# Patient Record
Sex: Male | Born: 1945 | Race: White | Hispanic: No | Marital: Married | Smoking: Current every day smoker
Health system: Southern US, Community
[De-identification: ages and names within clinical notes are randomized; demographics above are authoritative.]

## PROBLEM LIST (undated history)

## (undated) DIAGNOSIS — F1020 Alcohol dependence, uncomplicated: Secondary | ICD-10-CM

## (undated) DIAGNOSIS — K219 Gastro-esophageal reflux disease without esophagitis: Secondary | ICD-10-CM

## (undated) DIAGNOSIS — M1990 Unspecified osteoarthritis, unspecified site: Secondary | ICD-10-CM

## (undated) HISTORY — PX: NECK SURGERY: SHX720

---

## 2001-01-19 ENCOUNTER — Emergency Department (HOSPITAL_COMMUNITY): Admission: EM | Admit: 2001-01-19 | Discharge: 2001-01-19 | Payer: Self-pay | Admitting: Internal Medicine

## 2001-01-19 ENCOUNTER — Emergency Department (HOSPITAL_COMMUNITY): Admission: EM | Admit: 2001-01-19 | Discharge: 2001-01-19 | Payer: Self-pay | Admitting: Emergency Medicine

## 2001-07-14 ENCOUNTER — Emergency Department (HOSPITAL_COMMUNITY): Admission: EM | Admit: 2001-07-14 | Discharge: 2001-07-14 | Payer: Self-pay | Admitting: *Deleted

## 2001-07-14 ENCOUNTER — Emergency Department (HOSPITAL_COMMUNITY): Admission: EM | Admit: 2001-07-14 | Discharge: 2001-07-14 | Payer: Self-pay | Admitting: Emergency Medicine

## 2001-08-13 ENCOUNTER — Encounter: Payer: Self-pay | Admitting: Emergency Medicine

## 2001-08-13 ENCOUNTER — Emergency Department (HOSPITAL_COMMUNITY): Admission: EM | Admit: 2001-08-13 | Discharge: 2001-08-13 | Payer: Self-pay | Admitting: Emergency Medicine

## 2011-04-23 ENCOUNTER — Emergency Department (HOSPITAL_COMMUNITY): Payer: Medicare Other

## 2011-04-23 ENCOUNTER — Emergency Department (HOSPITAL_COMMUNITY)
Admission: EM | Admit: 2011-04-23 | Discharge: 2011-04-23 | Disposition: A | Discharge status: Home / Self Care | Payer: Medicare Other | Attending: Emergency Medicine | Admitting: Emergency Medicine

## 2011-04-23 ENCOUNTER — Encounter: Payer: Self-pay | Admitting: Emergency Medicine

## 2011-04-23 DIAGNOSIS — R112 Nausea with vomiting, unspecified: Secondary | ICD-10-CM

## 2011-04-23 DIAGNOSIS — F10929 Alcohol use, unspecified with intoxication, unspecified: Secondary | ICD-10-CM

## 2011-04-23 DIAGNOSIS — W19XXXA Unspecified fall, initial encounter: Secondary | ICD-10-CM | POA: Insufficient documentation

## 2011-04-23 DIAGNOSIS — Y9229 Other specified public building as the place of occurrence of the external cause: Secondary | ICD-10-CM | POA: Insufficient documentation

## 2011-04-23 DIAGNOSIS — R1110 Vomiting, unspecified: Secondary | ICD-10-CM | POA: Insufficient documentation

## 2011-04-23 DIAGNOSIS — S93409A Sprain of unspecified ligament of unspecified ankle, initial encounter: Secondary | ICD-10-CM

## 2011-04-23 DIAGNOSIS — F17200 Nicotine dependence, unspecified, uncomplicated: Secondary | ICD-10-CM | POA: Insufficient documentation

## 2011-04-23 LAB — BASIC METABOLIC PANEL
BUN: 7 mg/dL (ref 6–23)
Chloride: 96 mEq/L (ref 96–112)
Glucose, Bld: 84 mg/dL (ref 70–99)
Potassium: 3.8 mEq/L (ref 3.5–5.1)

## 2011-04-23 LAB — CBC
HCT: 39.3 % (ref 39.0–52.0)
Hemoglobin: 13.8 g/dL (ref 13.0–17.0)
MCH: 33.1 pg (ref 26.0–34.0)
MCHC: 35.1 g/dL (ref 30.0–36.0)
MCV: 94.2 fL (ref 78.0–100.0)
Platelets: 295 10*3/uL (ref 150–400)
RBC: 4.17 MIL/uL — ABNORMAL LOW (ref 4.22–5.81)
RDW: 13.3 % (ref 11.5–15.5)
WBC: 8.9 10*3/uL (ref 4.0–10.5)

## 2011-04-23 LAB — BASIC METABOLIC PANEL WITH GFR
CO2: 28 meq/L (ref 19–32)
Calcium: 8.6 mg/dL (ref 8.4–10.5)
Creatinine, Ser: 0.8 mg/dL (ref 0.50–1.35)
GFR calc Af Amer: >60 mL/min (ref >60)
GFR calc non Af Amer: >60 mL/min (ref >60)
Sodium: 134 meq/L — ABNORMAL LOW (ref 135–145)

## 2011-04-23 LAB — ETHANOL: Alcohol, Ethyl (B): 277 mg/dL — ABNORMAL HIGH (ref 0–11)

## 2011-04-23 MED ORDER — ONDANSETRON HCL 4 MG/2ML IJ SOLN: 4.0000 mg | Freq: Once | INTRAMUSCULAR | Status: DC

## 2011-04-23 NOTE — ED Notes (Signed)
Pt attempted to leave ed w/ iv in. Removed iv & advised nurse.
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
## 2011-04-23 NOTE — ED Notes (Signed)
IVC papers being resended by Dr. Oletta Lamas
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
## 2011-04-23 NOTE — ED Provider Notes (Addendum)
History     CSN: [id number] Arrival date & time: 04/23/2011  4:11 PM  Chief Complaint  Patient presents with  . Ankle Pain  . Fall   HPI Comments: A level V caveat due to the patient's condition and acute intoxication. Per EMS the patient sustained a fall due to his intoxication with pain to his left ankle in their opinion obvious deformity. The patient arrives with a long leg posterior splint applied to his left lower leg. The patient reports no significant injuries from the fall. While examining the patient the patient did lean over and vomited into the trash can. There is no solid food or any hematemesis present. The patient denies any chest pain, shortness of breath cough abdominal pain. He denies hitting his head. Per EMS there was no reported loss of consciousness.  Patient is a 64 y.o. male presenting with ankle pain and fall. The history is provided by the patient and the EMS personnel. The history is limited by the condition of the patient. No language interpreter was used.  Ankle Pain   Fall    History reviewed. No pertinent past medical history.  Past Surgical History  Procedure Date  . Neck surgery     No family history on file.  History  Substance Use Topics  . Smoking status: Current Everyday Smoker -- 0.5 packs/day  . Smokeless tobacco: Not on file  . Alcohol Use: Yes      Review of Systems  Unable to perform ROS: Other    Physical Exam  BP 107/67  Pulse 97  Temp(Src) 97.8 F (36.6 C) (Oral)  SpO2 96%  Physical Exam  Constitutional: He appears well-developed and well-nourished. No distress.  HENT:  Head: Normocephalic and atraumatic.  Neck: Normal range of motion. Neck supple. No spinous process tenderness and no muscular tenderness present.  Cardiovascular: Normal rate and regular rhythm.   Pulmonary/Chest: Effort normal. Not tachypneic. No respiratory distress.  Abdominal: Soft. There is no tenderness. There is no rebound.  Musculoskeletal:     Left ankle: He exhibits swelling. He exhibits normal pulse. tenderness. Lateral malleolus and medial malleolus tenderness found.  Neurological: He is alert. He has normal strength.  Psychiatric: His affect is inappropriate. His speech is slurred. He is is hyperactive. He is not withdrawn and not actively hallucinating. Thought content is not paranoid. Cognition and memory are impaired. He expresses impulsivity and inappropriate judgment. He expresses no suicidal plans and no homicidal plans.    ED Course  Procedures  MDM    5:06 PM  I reviewed patient's plain films. Per radiologist patient has no acute fracture of his tib-fib, ankle, foot on the left side.  Will place in ankle splint and retest his ankle function once he is feeling somewhat improved and more sober.    6:00 PM No more vomiting.  Pt is conversant, moving 4 extremities, recheck of ankle, shows swelling, but 5/5 strength with plantar flexion.  Will put in splint and can be discharged once an appropriate mechanism to get him home or to his friend's home that he is reportedly staying at can be found.    Gavin Pound. Pricila Bridge, MD 04/23/11 1801  6:57 PM Patient continues to behave inappropriately. Although he is able to ambulate well on this sprained ankle, she will not listen to staff or to myself as far as waiting here in the emergency department until a safe mode of transport to establish residence is transferred at this time please have been involved to  investigate where he reports his residence is to make sure that there is someone there to help him once he is actually discharge. For now we'll hold him involuntarily since he is intoxicated and shows an appropriate judgment and there is concern that he may harm himself due to his current intoxicated state.  7:00 PM Pt's ankle has been splinted, will not cooperate with using ice.  I don't feel taking NSAIDs given his current intoxication is needed.    Gavin Pound. Tomeeka Plaugher,  MD 04/23/11 1901   7:53 PM Pt now has a destination and safe method of transport.  May be discharged to home  Gavin Pound. Oletta Lamas, MD 04/23/11 1191

## 2011-05-01 ENCOUNTER — Emergency Department (HOSPITAL_COMMUNITY): Payer: Medicare Other

## 2011-05-01 ENCOUNTER — Encounter (HOSPITAL_COMMUNITY): Payer: Self-pay | Admitting: *Deleted

## 2011-05-01 ENCOUNTER — Emergency Department (HOSPITAL_COMMUNITY)
Admission: EM | Admit: 2011-05-01 | Discharge: 2011-05-01 | Disposition: A | Discharge status: Other Acute Inpatient Hospital | Payer: Medicare Other | Attending: Emergency Medicine | Admitting: Emergency Medicine

## 2011-05-01 ENCOUNTER — Inpatient Hospital Stay (HOSPITAL_COMMUNITY)
Admission: RE | Admit: 2011-05-01 | Discharge: 2011-05-06 | DRG: 897 | Disposition: A | Discharge status: Home / Self Care | Payer: 59 | Source: Ambulatory Visit | Attending: Psychiatry | Admitting: Psychiatry

## 2011-05-01 DIAGNOSIS — F1994 Other psychoactive substance use, unspecified with psychoactive substance-induced mood disorder: Secondary | ICD-10-CM

## 2011-05-01 DIAGNOSIS — F1010 Alcohol abuse, uncomplicated: Secondary | ICD-10-CM

## 2011-05-01 DIAGNOSIS — F17200 Nicotine dependence, unspecified, uncomplicated: Secondary | ICD-10-CM

## 2011-05-01 DIAGNOSIS — M810 Age-related osteoporosis without current pathological fracture: Secondary | ICD-10-CM

## 2011-05-01 DIAGNOSIS — S93409A Sprain of unspecified ligament of unspecified ankle, initial encounter: Secondary | ICD-10-CM | POA: Insufficient documentation

## 2011-05-01 DIAGNOSIS — F1020 Alcohol dependence, uncomplicated: Principal | ICD-10-CM

## 2011-05-01 DIAGNOSIS — M129 Arthropathy, unspecified: Secondary | ICD-10-CM

## 2011-05-01 DIAGNOSIS — S93402A Sprain of unspecified ligament of left ankle, initial encounter: Secondary | ICD-10-CM

## 2011-05-01 DIAGNOSIS — R45851 Suicidal ideations: Secondary | ICD-10-CM

## 2011-05-01 DIAGNOSIS — W19XXXA Unspecified fall, initial encounter: Secondary | ICD-10-CM | POA: Insufficient documentation

## 2011-05-01 DIAGNOSIS — Z79899 Other long term (current) drug therapy: Secondary | ICD-10-CM

## 2011-05-01 HISTORY — DX: Alcohol dependence, uncomplicated: F10.20

## 2011-05-01 LAB — RAPID URINE DRUG SCREEN, HOSP PERFORMED
Barbiturates: NOT DETECTED
Cocaine: NOT DETECTED
Tetrahydrocannabinol: NOT DETECTED

## 2011-05-01 LAB — BASIC METABOLIC PANEL
BUN: 7 mg/dL (ref 6–23)
Chloride: 94 mEq/L — ABNORMAL LOW (ref 96–112)
Glucose, Bld: 78 mg/dL (ref 70–99)
Potassium: 4.2 mEq/L (ref 3.5–5.1)

## 2011-05-01 LAB — DIFFERENTIAL
Basophils Relative: 1 % (ref 0–1)
Eosinophils Absolute: 0.4 10*3/uL (ref 0.0–0.7)
Eosinophils Relative: 5 % (ref 0–5)
Monocytes Absolute: 0.7 10*3/uL (ref 0.1–1.0)
Monocytes Relative: 8 % (ref 3–12)

## 2011-05-01 LAB — CBC
HCT: 40.5 % (ref 39.0–52.0)
Hemoglobin: 14.6 g/dL (ref 13.0–17.0)
MCH: 34.1 pg — ABNORMAL HIGH (ref 26.0–34.0)
MCHC: 36 g/dL (ref 30.0–36.0)
MCV: 94.6 fL (ref 78.0–100.0)

## 2011-05-01 NOTE — ED Notes (Signed)
Dakota Higgins, from [facility] aware of pt here

## 2011-05-01 NOTE — ED Notes (Addendum)
Pt wants help to quit drinking. Pt states that he has been drunk for 2 weeks. Pt states that he wanted to kill himself today. Pt states that he has not eat for 3 weeks. Pt also c/o pain and swelling in his left ankle from injury 2 weeks ago.

## 2011-05-01 NOTE — ED Provider Notes (Signed)
History     CSN: [id number] Arrival date & time: 05/01/2011  1:50 PM  Chief Complaint  Patient presents with  . Addiction Problem   HPI Pt was seen at 1410.  Per pt, c/o gradual onset and worsening of persistent etoh abuse "all my life," worse over the past 2 weeks.  Pt states he called 911 today because he was "going to kill himself by jumping off the balcony" of a local hotel.  Pt c/o continued left ankle pain and swelling from fall 1 week ago, for which he was seen in ED for same, dx ankle sprain.  Pt states he "was too drunk" and "doesn't remember" that ED visit.  Denies CP/SOB, no abd pain, no back pain, no N/V/D, no focal motor weakness, no tingling/numbness in extremities.  Denies HI.     Past Medical History  Diagnosis Date  . Alcoholism     Past Surgical History  Procedure Date  . Neck surgery   . Neck surgery     History reviewed. No pertinent family history.  History  Substance Use Topics  . Smoking status: Current Everyday Smoker -- 1.0 packs/day  . Smokeless tobacco: Not on file  . Alcohol Use: Yes     daily beer and occasional liquor     Review of Systems ROS: Statement: All systems negative except as marked or noted in the HPI; Constitutional: Negative for fever and chills. ; ; Eyes: Negative for eye pain, redness and discharge. ; ; ENMT: Negative for ear pain, hoarseness, nasal congestion, sinus pressure and sore throat. ; ; Cardiovascular: Negative for chest pain, palpitations, diaphoresis, dyspnea and peripheral edema. ; ; Respiratory: Negative for cough, wheezing and stridor. ; ; Gastrointestinal: Negative for nausea, vomiting, diarrhea and abdominal pain, blood in stool, hematemesis, jaundice and rectal bleeding. . ; ; Genitourinary: Negative for dysuria, flank pain and hematuria. ; ; Musculoskeletal: Negative for back pain and neck pain. Negative for new trauma. +left ankle swelling. ; Skin: Negative for pruritus, rash, abrasions, blisters, bruising and skin  lesion.; ; Neuro: Negative for headache, lightheadedness and neck stiffness. Negative for weakness, altered level of consciousness , altered mental status, extremity weakness, paresthesias, involuntary movement, seizure and syncope.  +SI, no HI.   Physical Exam  BP 142/101  Pulse 98  Temp(Src) 98.3 F (36.8 C) (Oral)  Resp 18  Ht 5\' 10"  (1.778 m)  Wt 140 lb (63.504 kg)  BMI 20.09 kg/m2  SpO2 98%  Physical Exam 1415: Physical examination:  Nursing notes reviewed; Vital signs and O2 SAT reviewed;  Constitutional: Well developed, Well nourished, Well hydrated, In no acute distress; Head:  Normocephalic, atraumatic; Eyes: EOMI, PERRL, No scleral icterus; ENMT: Mouth and pharynx normal, Mucous membranes moist; Neck: Supple, Full range of motion, No lymphadenopathy; Cardiovascular: Regular rate and rhythm, No murmur, rub, or gallop; Respiratory: Breath sounds clear & equal bilaterally, No rales, rhonchi, wheezes, or rub, Normal respiratory effort/excursion; Chest: Nontender, Movement normal; Abdomen: Soft, Nontender, Nondistended, Normal bowel sounds; Extremities: Pulses normal, No calf edema or asymmetry. +mild tenderness to palp left lateral maleolar area w/localized edema, NMS intact left foot, strong pedal pp, LE muscle compartments soft.  No left proximal fibular head tenderness, no knee tenderness.  +plantarflexion of left foot w/calf squeeze.  No palpable gap left Achilles's tendon.; Neuro: AA&Ox3, Major CN grossly intact.  No gross focal motor or sensory deficits in extremities.; Skin: Color normal, Warm, Dry.  Affect:  Flat, +SI.   ED Course  Procedures  MDM  MDM Reviewed: previous chart, nursing note and vitals Interpretation: labs and x-ray   Seen in ED for etoh intox and fall 1 week ago.  XR left ankle and foot at that were neg for acute fx.  Will re-check left ankle xray today, ck medical clearance labs, consult [facility] for admit.  1715:  [facility] team at bedside for eval.   7:33 PM:  Pt  accepted to [facility].  Will transfer stable.   Bach Rocchi Allison Quarry, DO 05/03/11 2138

## 2011-05-02 DIAGNOSIS — F39 Unspecified mood [affective] disorder: Secondary | ICD-10-CM

## 2011-05-02 DIAGNOSIS — F1020 Alcohol dependence, uncomplicated: Secondary | ICD-10-CM

## 2011-05-02 LAB — HEPATIC FUNCTION PANEL
AST: 49 U/L — ABNORMAL HIGH (ref 0–37)
Albumin: 3.5 g/dL (ref 3.5–5.2)
Bilirubin, Direct: 0.1 mg/dL (ref 0.0–0.3)

## 2011-05-05 NOTE — Assessment & Plan Note (Signed)
NAMEMarland Higgins  BOWMAN, [name]:  [id number]  MEDICAL RECORD NO.:  [id number]  LOCATION:  0307                          [facility]ACILITY:  [facility]  PHYSICIAN:  Orson Aloe, MD       DATE OF BIRTH:  06-17-1946  DATE OF ADMISSION:  05/01/2011 DATE OF DISCHARGE:                      PSYCHIATRIC ADMISSION ASSESSMENT   IDENTIFICATION:  This is a 65 year old male who was voluntarily admitted on May 01, 2011.  HISTORY OF PRESENT ILLNESS:  The patient presents with a history depression and suicidal thoughts, also wanting to get detoxed off of alcohol.  He has been binge drinking for approximately 3 weeks.  His drinking has been escalating with changes in his behavior.  He was having some passive suicidal thoughts to have someone take his life.  He had a recent breakup with his girlfriend.  She was getting tired of his behavior when his drinking increases, and she returned back home from a trip that they were on.  He has had several periods of sobriety with his longest being about 2-1/2 years.  PAST PSYCHIATRIC HISTORY:  First admission to [facility]. No current outpatient mental health treatment.  SOCIAL HISTORY:  The patient is single.  He has been in a relationship for the past 11 years.  He is retired, hoping to return back to [place].  He has been in [employer] and has past charges of public intoxication.  FAMILY HISTORY:  None.  ALCOHOL AND DRUG HISTORY:  Again, the patient has been drinking heavily. He denies any seizures or blackouts and has no current legal issues.  He denies any other substance use, and his longest history of sobriety has been 2-1/2 years.  PRIMARY CARE PROVIDER:  [facility].  MEDICAL PROBLEMS: 1. Recent ankle injury.  The patient does not remember sustaining any     injury, but he had woken up with a swollen ankle. 2. History of arthritis.  MEDICATIONS: 1. The patient lists Paxil 40 mg daily. 2. Quetiapine 100 mg nightly. 3.  Megestrol 200 mg by mouth every day to improve anorexia. 4. Etodolac 1 tablet by mouth b.i.d. for arthritis. 5. Calcium with vitamin D b.i.d. for prevention of osteoporosis. 6. Alendronate 1 tablet by mouth weekly to be given on Mondays.  DRUG ALLERGIES:  No known allergies.  PHYSICAL EXAM:  GENERAL:  This is a slender middle-aged male assessed in the emergency department.  He voices no complaints.  His alcohol level was 146 upon arrival to the emergency room.  His left ankle shows soft- tissue swelling.  Sodium of 132, potassium of 4.2.  Urine drug screen is negative.  MENTAL STATUS EXAM:  The patient is resting in bed.  He is dressed in scrubs.  He is cooperative.  His speech is clear, normal pace and tone. He gets teary-eyed at times.  He wants to get help with his alcohol use. He feels he could also benefit from some counseling.  He denies any suicidal thoughts or any psychotic symptoms.  Cognitive function intact. His memory appears intact.  Judgment and insight appear to be good.  DIAGNOSES:  Axis I:  Alcohol dependence, rule out substance-induced mood disorder. Axis II:  Deferred. Axis III:  History of arthritis and recent ankle injury. Axis IV:  Psychosocial problems related to chronic alcohol use and possible problems related to his girlfriend. Axis V:  GAF is 35-40.  PLAN:  Our plan is to place the patient on a Librium protocol to monitor any withdrawal symptoms.  We will continue with his Paxil and Seroquel, and we encourage fluids and group activity.  We will help the patient obtain follow-up.  His tentative length of stay at this time is 3-5 days.     Dakota Higgins, N.P.   ______________________________ Orson Aloe, MD    JO/MEDQ  D:  05/02/2011  T:  05/02/2011  Job:  [id number]  Electronically Signed by Limmie PatriciaP. on 05/04/2011 09:09:58 AM Electronically Signed by Orson Aloe  on 05/05/2011 04:32:55 PM
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
## 2014-05-16 ENCOUNTER — Observation Stay (HOSPITAL_COMMUNITY): Payer: Non-veteran care

## 2014-05-16 ENCOUNTER — Emergency Department (HOSPITAL_COMMUNITY): Payer: Non-veteran care

## 2014-05-16 ENCOUNTER — Observation Stay (HOSPITAL_COMMUNITY)
Admission: EM | Admit: 2014-05-16 | Discharge: 2014-05-17 | Disposition: A | Discharge status: Home / Self Care | Payer: Non-veteran care | Attending: Internal Medicine | Admitting: Internal Medicine

## 2014-05-16 ENCOUNTER — Encounter (HOSPITAL_COMMUNITY): Payer: Self-pay | Admitting: Emergency Medicine

## 2014-05-16 DIAGNOSIS — Z79899 Other long term (current) drug therapy: Secondary | ICD-10-CM | POA: Insufficient documentation

## 2014-05-16 DIAGNOSIS — R079 Chest pain, unspecified: Principal | ICD-10-CM | POA: Insufficient documentation

## 2014-05-16 DIAGNOSIS — F17200 Nicotine dependence, unspecified, uncomplicated: Secondary | ICD-10-CM | POA: Insufficient documentation

## 2014-05-16 DIAGNOSIS — M129 Arthropathy, unspecified: Secondary | ICD-10-CM | POA: Diagnosis not present

## 2014-05-16 DIAGNOSIS — R9389 Abnormal findings on diagnostic imaging of other specified body structures: Secondary | ICD-10-CM | POA: Diagnosis present

## 2014-05-16 DIAGNOSIS — K219 Gastro-esophageal reflux disease without esophagitis: Secondary | ICD-10-CM | POA: Insufficient documentation

## 2014-05-16 DIAGNOSIS — R209 Unspecified disturbances of skin sensation: Secondary | ICD-10-CM | POA: Diagnosis not present

## 2014-05-16 DIAGNOSIS — Z720 Tobacco use: Secondary | ICD-10-CM | POA: Diagnosis present

## 2014-05-16 HISTORY — DX: Unspecified osteoarthritis, unspecified site: M19.90

## 2014-05-16 HISTORY — DX: Gastro-esophageal reflux disease without esophagitis: K21.9

## 2014-05-16 LAB — CBC
HEMATOCRIT: 38.7 % — AB (ref 39.0–52.0)
HEMOGLOBIN: 13.4 g/dL (ref 13.0–17.0)
MCH: 33.3 pg (ref 26.0–34.0)
MCHC: 34.6 g/dL (ref 30.0–36.0)
MCV: 96.3 fL (ref 78.0–100.0)
Platelets: 291 10*3/uL (ref 150–400)
RBC: 4.02 MIL/uL — ABNORMAL LOW (ref 4.22–5.81)
RDW: 14.1 % (ref 11.5–15.5)
WBC: 6.9 10*3/uL (ref 4.0–10.5)

## 2014-05-16 LAB — URINALYSIS, ROUTINE W REFLEX MICROSCOPIC
Bilirubin Urine: NEGATIVE
Glucose, UA: NEGATIVE mg/dL
Hgb urine dipstick: NEGATIVE
KETONES UR: NEGATIVE mg/dL
LEUKOCYTES UA: NEGATIVE
NITRITE: NEGATIVE
PROTEIN: NEGATIVE mg/dL
Specific Gravity, Urine: 1.013 (ref 1.005–1.030)
UROBILINOGEN UA: 0.2 mg/dL (ref 0.0–1.0)
pH: 7 (ref 5.0–8.0)

## 2014-05-16 LAB — COMPREHENSIVE METABOLIC PANEL
ALBUMIN: 3.5 g/dL (ref 3.5–5.2)
ALT: 11 U/L (ref 0–53)
ANION GAP: 11 (ref 5–15)
AST: 16 U/L (ref 0–37)
Alkaline Phosphatase: 66 U/L (ref 39–117)
BUN: 15 mg/dL (ref 6–23)
CALCIUM: 8.6 mg/dL (ref 8.4–10.5)
CO2: 28 mEq/L (ref 19–32)
Chloride: 103 mEq/L (ref 96–112)
Creatinine, Ser: 0.96 mg/dL (ref 0.50–1.35)
GFR calc non Af Amer: 83 mL/min — ABNORMAL LOW (ref >90)
GLUCOSE: 89 mg/dL (ref 70–99)
POTASSIUM: 3.8 meq/L (ref 3.7–5.3)
Sodium: 142 mEq/L (ref 137–147)
TOTAL PROTEIN: 6.7 g/dL (ref 6.0–8.3)
Total Bilirubin: <0.2 mg/dL — ABNORMAL LOW (ref 0.3–1.2)

## 2014-05-16 LAB — I-STAT TROPONIN, ED
TROPONIN I, POC: 0.01 ng/mL (ref 0.00–0.08)
Troponin i, poc: 0 ng/mL (ref 0.00–0.08)

## 2014-05-16 LAB — PROTIME-INR
INR: 1.19 (ref 0.00–1.49)
PROTHROMBIN TIME: 15.1 s (ref 11.6–15.2)

## 2014-05-16 LAB — TROPONIN I: Troponin I: <0.3 ng/mL (ref <0.30)

## 2014-05-16 LAB — APTT: APTT: 31 s (ref 24–37)

## 2014-05-16 MED ORDER — ONDANSETRON HCL 4 MG/2ML IJ SOLN: 4.0000 mg | Freq: Four times a day (QID) | INTRAMUSCULAR | Status: DC | PRN

## 2014-05-16 MED ORDER — GI COCKTAIL ~~LOC~~
30.0000 mL | Freq: Once | ORAL | Status: AC
  Administered 2014-05-16: 30 mL via ORAL
  Filled 2014-05-16: qty 30

## 2014-05-16 MED ORDER — ASPIRIN 81 MG PO CHEW: 324.0000 mg | CHEWABLE_TABLET | Freq: Once | ORAL | Status: DC

## 2014-05-16 MED ORDER — SODIUM CHLORIDE 0.9 % IV SOLN
INTRAVENOUS | Status: AC
  Administered 2014-05-16: 18:00:00 via INTRAVENOUS

## 2014-05-16 MED ORDER — ALUM & MAG HYDROXIDE-SIMETH 200-200-20 MG/5ML PO SUSP: 30.0000 mL | Freq: Four times a day (QID) | ORAL | Status: DC | PRN

## 2014-05-16 MED ORDER — ACETAMINOPHEN 325 MG PO TABS
650.0000 mg | ORAL_TABLET | ORAL | Status: DC | PRN
  Administered 2014-05-17: 650 mg via ORAL
  Filled 2014-05-16: qty 2

## 2014-05-16 MED ORDER — NICOTINE 21 MG/24HR TD PT24
21.0000 mg | MEDICATED_PATCH | TRANSDERMAL | Status: DC
  Administered 2014-05-16: 21 mg via TRANSDERMAL
  Filled 2014-05-16 (×2): qty 1

## 2014-05-16 MED ORDER — MORPHINE SULFATE 2 MG/ML IJ SOLN: 2.0000 mg | INTRAMUSCULAR | Status: DC | PRN

## 2014-05-16 MED ORDER — IOHEXOL 300 MG/ML  SOLN
100.0000 mL | Freq: Once | INTRAMUSCULAR | Status: AC | PRN
  Administered 2014-05-16: 75 mL via INTRAVENOUS

## 2014-05-16 MED ORDER — NITROGLYCERIN 0.4 MG SL SUBL: 0.4000 mg | SUBLINGUAL_TABLET | SUBLINGUAL | Status: DC | PRN

## 2014-05-16 MED ORDER — MORPHINE SULFATE 2 MG/ML IJ SOLN
2.0000 mg | Freq: Once | INTRAMUSCULAR | Status: AC
  Administered 2014-05-16: 2 mg via INTRAVENOUS
  Filled 2014-05-16: qty 1

## 2014-05-16 MED ORDER — NITROGLYCERIN 0.4 MG SL SUBL
0.4000 mg | SUBLINGUAL_TABLET | SUBLINGUAL | Status: AC | PRN
  Administered 2014-05-16 (×3): 0.4 mg via SUBLINGUAL
  Filled 2014-05-16: qty 1

## 2014-05-16 MED ORDER — SODIUM CHLORIDE 0.9 % IV SOLN
20.0000 mL | INTRAVENOUS | Status: DC
  Administered 2014-05-16: 20 mL via INTRAVENOUS

## 2014-05-16 MED ORDER — ASPIRIN EC 81 MG PO TBEC
81.0000 mg | DELAYED_RELEASE_TABLET | Freq: Every day | ORAL | Status: DC
  Administered 2014-05-17: 81 mg via ORAL
  Filled 2014-05-16: qty 1

## 2014-05-16 MED ORDER — HEPARIN SODIUM (PORCINE) 5000 UNIT/ML IJ SOLN
5000.0000 [IU] | Freq: Three times a day (TID) | INTRAMUSCULAR | Status: DC
  Filled 2014-05-16 (×3): qty 1
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
## 2014-05-16 NOTE — H&P (Signed)
Patient Demographics  Dakota Higgins, is a 68 y.o. male  MRN: [id number]   DOB - Oct 22, 1945  Admit Date - 05/16/2014  Outpatient Primary MD for the patient is No primary provider on file.   With History of -  Past Medical History  Diagnosis Date  . Alcoholism       Past Surgical History  Procedure Laterality Date  . Neck surgery    . Neck surgery      in for   Chief Complaint  Patient presents with  . Numbness  . Chest Pain     HPI  Dakota Higgins  is a 68 y.o. male, presents with complaints of chest pain, reports he was traveling from [place] to [place] to visit his brother, he was hitchhiking , and on the had complaints of chest pain, midsternal, sharp quality, nonradiating,, associated with mild shortness of breath, reports currently pain much improved after he was given nitroglycerin, and IV morphine, he was given 324 mg of aspirin by EMS, reports had similar chest pain 3 years ago where he was admitted to [facility], and he reports he had a stress test done then, which reports it was negative, hospitalist service requested to admit the patient for further evaluation and workup, his first troponin was negative, EKG did not show acute ST or T wave abnormalities. Patient denies any hemoptysis, cough tender, or leg swelling. Patient had chest x-ray done, which did show a right tracheal opacity.   Review of Systems    In addition to the HPI above, No Fever-chills, No Headache, No changes with Vision or hearing, No problems swallowing food or Liquids, Complaints of Chest pain,Shortness of Breath, denies any cough or productive sputum No Abdominal pain, No Nausea or Vommitting, Bowel movements are regular, No Blood in stool or Urine, No dysuria, No new skin rashes or bruises, No new joints pains-aches,  No new weakness, tingling, numbness in any extremity, No recent weight gain or loss, No polyuria, polydypsia or polyphagia, No significant Mental  Stressors.  A full 10 point Review of Systems was done, except as stated above, all other Review of Systems were negative.   Social History History  Substance Use Topics  . Smoking status: Current Every Day Smoker -- 1.00 packs/day  . Smokeless tobacco: Not on file  . Alcohol Use: Yes     Comment: daily beer and occasional liquor   Family History History reviewed. No pertinent family history.   Prior to Admission medications   Medication Sig Start Date End Date Taking? Authorizing Provider  PRESCRIPTION MEDICATION Take 1 tablet by mouth daily. Blood Pressure Medication   Yes Historical Provider, MD    Allergies  Allergen Reactions  . Strawberry Hives and Rash  . Tomato Hives and Rash    Physical Exam  Vitals  Blood pressure 125/65, pulse 88, resp. rate 20, SpO2 98.00%.   1. General elderly male lying in bed in NAD,    2. Normal affect and insight, Not Suicidal or Homicidal, Awake Alert, Oriented X 3.  3. No F.N deficits, ALL C.Nerves Intact, Strength 5/5 all 4 extremities, Sensation intact all 4 extremities, Plantars down going.  4. Ears and Eyes appear Normal, Conjunctivae clear, PERRLA. Moist Oral Mucosa.  5. Supple Neck, No JVD, No cervical lymphadenopathy appriciated, No Carotid Bruits.  6. Symmetrical Chest wall movement, Good air movement bilaterally, CTAB, has reproducible chest pain on palpation and epigastric area resembles his chest pain upon presentation.  7. RRR, No  Gallops, Rubs or Murmurs, No Parasternal Heave.  8. Positive Bowel Sounds, Abdomen Soft, No tenderness, No organomegaly appriciated,No rebound -guarding or rigidity.  9.  No Cyanosis, Normal Skin Turgor, No Skin Rash or Bruise.  10. Good muscle tone,  joints appear normal , no effusions, Normal ROM.  11. No Palpable Lymph Nodes in Neck or Axillae    Data Review  CBC  Recent Labs Lab 05/16/14 1235  WBC 6.9  HGB 13.4  HCT 38.7*  PLT 291  MCV 96.3  MCH 33.3  MCHC 34.6  RDW  14.1   ------------------------------------------------------------------------------------------------------------------  Chemistries   Recent Labs Lab 05/16/14 1235  NA 142  K 3.8  CL 103  CO2 28  GLUCOSE 89  BUN 15  CREATININE 0.96  CALCIUM 8.6  AST 16  ALT 11  ALKPHOS 66  BILITOT <0.2*   ------------------------------------------------------------------------------------------------------------------ CrCl is unknown because both a height and weight (above a minimum accepted value) are required for this calculation. ------------------------------------------------------------------------------------------------------------------ No results found for this basename: TSH, T4TOTAL, FREET3, T3FREE, THYROIDAB,  in the last 72 hours   Coagulation profile  Recent Labs Lab 05/16/14 1235  INR 1.19   ------------------------------------------------------------------------------------------------------------------- No results found for this basename: DDIMER,  in the last 72 hours -------------------------------------------------------------------------------------------------------------------  Cardiac Enzymes No results found for this basename: CK, CKMB, TROPONINI, MYOGLOBIN,  in the last 168 hours ------------------------------------------------------------------------------------------------------------------ No components found with this basename: POCBNP,    ---------------------------------------------------------------------------------------------------------------  Urinalysis    Component Value Date/Time   COLORURINE YELLOW 05/16/2014 1416   APPEARANCEUR CLEAR 05/16/2014 1416   LABSPEC 1.013 05/16/2014 1416   PHURINE 7.0 05/16/2014 1416   GLUCOSEU NEGATIVE 05/16/2014 1416   HGBUR NEGATIVE 05/16/2014 1416   BILIRUBINUR NEGATIVE 05/16/2014 1416   KETONESUR NEGATIVE 05/16/2014 1416   PROTEINUR NEGATIVE 05/16/2014 1416   UROBILINOGEN 0.2 05/16/2014 1416   NITRITE NEGATIVE  05/16/2014 1416   LEUKOCYTESUR NEGATIVE 05/16/2014 1416    ----------------------------------------------------------------------------------------------------------------  Imaging results:   Dg Chest Portable 1 View  05/16/2014   CLINICAL DATA:  Numbness, chest pain  EXAM: PORTABLE CHEST - 1 VIEW  COMPARISON:  None.  FINDINGS: Cardiac and mediastinal contours are within normal limits. Prominence of the right paratracheal soft tissues with focal opacity in the right apex. No focal airspace consolidation, pleural effusion or pneumothorax. No evidence of pulmonary edema. No acute osseous abnormality. Geographic opacity in the right mid to upper lung zone with well-defined in angulated margins favored to reflect artifact related to the presence of skin folds  IMPRESSION: 1. No acute cardiopulmonary process. 2. Right paratracheal soft tissue opacity in the lung apex favored to reflect prominent underlying vascular structures. However, a right apical pulmonary mass/nodule is difficult to exclude entirely. Consider further evaluation with nonemergent CT scan of the chest with contrast.   Electronically Signed   By: Malachy Moan M.D.   On: 05/16/2014 13:18    My personal review of EKG: Rhythm NSR, Rate  84 /min, QTc 453 , no Acute ST changes, Q waves in the anterior leads.    Assessment & Plan  Active Problems:   Chest pain   Tobacco abuse   Abnormal CXR (chest x-ray)    1. chest pain:  Appears to be having some musculoskeletal quality, given poor followup, and smoking history, patient will be admitted for further workup, he will be admitted to telemetry unit, will continue to cycle his cardiac enzymes and follow the trend, will continue him on aspirin, sublingual nitroglycerin, and repeat EKG in a.m.Marland Kitchen  2. Tobacco abuse: Patient was counseled, will be started on nicotine patch.  3. Abnormal x-ray showing right facial opacity: Will obtain CT chest without contrast for further  evaluation.   DVT Prophylaxis Heparin - SCDs  AM Labs Ordered, also please review Full Orders  Family Communication: Admission, patients condition and plan of care including tests being ordered have been discussed with the patient  who indicate understanding and agree with the plan and Code Status.  Code Status Full  Likely DC to  home  Condition GUARDED    Time spent in minutes : 50 minutes.    Randol Kern, Basma Buchner M.D on 05/16/2014 at 4:00 PM  Between 7am to 7pm - Pager - [phone]  After 7pm go to [email or web address] - password [email or web address]  And look for the night coverage person covering me after hours  [facility] Office  [phone]   **Disclaimer: This note may have been dictated with voice recognition software. Similar sounding words can inadvertently be transcribed and this note may contain transcription errors which may not have been corrected upon publication of note.**

## 2014-05-16 NOTE — ED Notes (Signed)
Per [facility], pt had right arm numbness with hand contraction. When EMS arrived pt began c/o chest pain and left shoulder/arm pain. Pt had 324 ASA and 1 nitro tablet with EMS.

## 2014-05-17 ENCOUNTER — Emergency Department (HOSPITAL_COMMUNITY): Payer: Medicare Other

## 2014-05-17 ENCOUNTER — Emergency Department (HOSPITAL_COMMUNITY)
Admission: EM | Admit: 2014-05-17 | Discharge: 2014-05-17 | Disposition: A | Discharge status: Home / Self Care | Payer: Medicare Other | Attending: Emergency Medicine | Admitting: Emergency Medicine

## 2014-05-17 ENCOUNTER — Encounter (HOSPITAL_COMMUNITY): Payer: Self-pay | Admitting: Emergency Medicine

## 2014-05-17 DIAGNOSIS — F17200 Nicotine dependence, unspecified, uncomplicated: Secondary | ICD-10-CM

## 2014-05-17 DIAGNOSIS — R072 Precordial pain: Secondary | ICD-10-CM

## 2014-05-17 DIAGNOSIS — R079 Chest pain, unspecified: Secondary | ICD-10-CM

## 2014-05-17 DIAGNOSIS — G5630 Lesion of radial nerve, unspecified upper limb: Secondary | ICD-10-CM | POA: Insufficient documentation

## 2014-05-17 DIAGNOSIS — R9389 Abnormal findings on diagnostic imaging of other specified body structures: Secondary | ICD-10-CM

## 2014-05-17 DIAGNOSIS — M25519 Pain in unspecified shoulder: Secondary | ICD-10-CM | POA: Insufficient documentation

## 2014-05-17 DIAGNOSIS — G5631 Lesion of radial nerve, right upper limb: Secondary | ICD-10-CM

## 2014-05-17 DIAGNOSIS — K219 Gastro-esophageal reflux disease without esophagitis: Secondary | ICD-10-CM | POA: Insufficient documentation

## 2014-05-17 DIAGNOSIS — F1021 Alcohol dependence, in remission: Secondary | ICD-10-CM | POA: Insufficient documentation

## 2014-05-17 DIAGNOSIS — Z8739 Personal history of other diseases of the musculoskeletal system and connective tissue: Secondary | ICD-10-CM | POA: Insufficient documentation

## 2014-05-17 LAB — TROPONIN I
Troponin I: <0.3 ng/mL (ref <0.30)
Troponin I: <0.3 ng/mL (ref <0.30)

## 2014-05-17 MED ORDER — TRAMADOL HCL 50 MG PO TABS: 50.0000 mg | ORAL_TABLET | Freq: Three times a day (TID) | ORAL | Status: DC | PRN

## 2014-05-17 MED ORDER — IBUPROFEN 800 MG PO TABS
800.0000 mg | ORAL_TABLET | Freq: Once | ORAL | Status: AC
  Administered 2014-05-17: 800 mg via ORAL
  Filled 2014-05-17: qty 1

## 2014-05-17 MED ORDER — PANTOPRAZOLE SODIUM 40 MG PO TBEC: 40.0000 mg | DELAYED_RELEASE_TABLET | Freq: Every day | ORAL | Status: AC

## 2014-05-17 MED ORDER — NITROGLYCERIN 0.4 MG SL SUBL: 0.4000 mg | SUBLINGUAL_TABLET | SUBLINGUAL | Status: DC | PRN

## 2014-05-17 NOTE — Progress Notes (Signed)
Utilization Review Completed.Mindy Gali T9/19/2015  

## 2014-05-17 NOTE — Progress Notes (Signed)
Patient ID: KOBEE MEDLEN  male  ZOX:[id number]    DOB: [birth year]    DOA: 05/16/2014  PCP: No primary provider on file.  Assessment/Plan: Principal Problem:   Chest pain - Improved, atypical, only distract her past smoking history, patient follows the hospital and reports that he had a stress test done which was last year and was normal. - Serial troponins negative - EKG did not show acute ST-T wave changes of ischemia - Follow 2-D echocardiogram to rule out any wall motion abnormalities  Active Problems:   Tobacco abuse - Patient placed on nicotine patch    Abnormal CXR (chest x-ray) - Chest x-ray showed right paratracheal soft tissue opacity in the lung apex -  CT of the chest showed no lung mass, likely reflected a combination of medial scapular border and soft tissue fold. 1 mm left upper lobe nodule, followup chest CT in 6-12 months.    DVT Prophylaxis:  Code Status: Full code  Family Communication:  Disposition: Awaiting 2-D echocardiogram  Consultants:  None  Procedures:  None  Antibiotics:  None    Subjective: Patient seen and examined, denies any specific complaints at this time, chest pain has improved, no shortness of breath, fevers or chills or cough  Objective: Weight change:  No intake or output data in the 24 hours ending 05/17/14 1042 Blood pressure 114/62, pulse 58, temperature 98 F (36.7 C), temperature source Oral, resp. rate 16, SpO2 99.00%.  Physical Exam: General: Alert and awake, oriented x3, not in any acute distress. CVS: S1-S2 clear, no murmur rubs or gallops Chest: clear to auscultation bilaterally, no wheezing, rales or rhonchi Abdomen: soft nontender, nondistended, normal bowel sounds  Extremities: no cyanosis, clubbing or edema noted bilaterally Neuro: Cranial nerves II-XII intact, no focal neurological deficits  Lab Results: Basic Metabolic Panel:  Recent Labs Lab 05/16/14 1235  NA 142  K 3.8  CL 103  CO2 28    GLUCOSE 89  BUN 15  CREATININE 0.96  CALCIUM 8.6   Liver Function Tests:  Recent Labs Lab 05/16/14 1235  AST 16  ALT 11  ALKPHOS 66  BILITOT <0.2*  PROT 6.7  ALBUMIN 3.5   No results found for this basename: LIPASE, AMYLASE,  in the last 168 hours No results found for this basename: AMMONIA,  in the last 168 hours CBC:  Recent Labs Lab 05/16/14 1235  WBC 6.9  HGB 13.4  HCT 38.7*  MCV 96.3  PLT 291   Cardiac Enzymes:  Recent Labs Lab 05/16/14 1835 05/17/14 0056 05/17/14 0700  TROPONINI <0.30 <0.30 <0.30   BNP: No components found with this basename: POCBNP,  CBG: No results found for this basename: GLUCAP,  in the last 168 hours   Micro Results: No results found for this or any previous visit (from the past 240 hour(s)).  Studies/Results: Ct Chest W Contrast  05/16/2014   CLINICAL DATA:  Evaluate for possible mass seen on chest radiograph.  EXAM: CT CHEST WITH CONTRAST  TECHNIQUE: Multidetector CT imaging of the chest was performed during intravenous contrast administration.  CONTRAST:  75mL OMNIPAQUE IOHEXOL 300 MG/ML  SOLN  COMPARISON:  Current chest radiograph.  FINDINGS: There is no lung mass or abnormality to correspond to the shadow on the current chest radiograph. The shadow likely reflected a combination of a soft tissue fold and the medial scapular border.  5 mm nodule lies in the left upper lobe on image 29, series 202. Minor pleural parenchymal scarring at the  apices. Lungs are otherwise clear. No pleural effusion.  Heart is normal in size and configuration. Great vessels are normal in caliber. Mild atherosclerotic plaque noted along thoracic aorta. No mediastinal or hilar masses. No adenopathy. No neck base or axillary masses or adenopathy.  Limited evaluation of the upper abdomen demonstrates a stomach moderately distended with debris but is otherwise unremarkable.  There are mild degenerative changes throughout the thoracic spine. No osteoblastic or  osteolytic lesions.  IMPRESSION: 1. No lung mass. The abnormality on the current chest radiograph likely reflected a combination of the medial scapular border and a soft tissue fold. 2. I mm left upper lobe nodule. If the patient is at high risk for bronchogenic carcinoma, follow-up chest CT at 6-12 months is recommended. If the patient is at low risk for bronchogenic carcinoma, follow-up chest CT at 12 months is recommended. This recommendation follows the consensus statement: Guidelines for Management of Small Pulmonary Nodules Detected on CT Scans: A Statement from the Fleischner Society as published in Radiology 2005;237:395-400. 3. Lungs otherwise clear. 4. Stomach moderately distended with ingested material.   Electronically Signed   By: Amie Portland M.D.   On: 05/16/2014 20:09   Dg Chest Portable 1 View  05/16/2014   CLINICAL DATA:  Numbness, chest pain  EXAM: PORTABLE CHEST - 1 VIEW  COMPARISON:  None.  FINDINGS: Cardiac and mediastinal contours are within normal limits. Prominence of the right paratracheal soft tissues with focal opacity in the right apex. No focal airspace consolidation, pleural effusion or pneumothorax. No evidence of pulmonary edema. No acute osseous abnormality. Geographic opacity in the right mid to upper lung zone with well-defined in angulated margins favored to reflect artifact related to the presence of skin folds  IMPRESSION: 1. No acute cardiopulmonary process. 2. Right paratracheal soft tissue opacity in the lung apex favored to reflect prominent underlying vascular structures. However, a right apical pulmonary mass/nodule is difficult to exclude entirely. Consider further evaluation with nonemergent CT scan of the chest with contrast.   Electronically Signed   By: Malachy Moan M.D.   On: 05/16/2014 13:18    Medications: Scheduled Meds: . aspirin  324 mg Oral Once  . aspirin EC  81 mg Oral Daily  . heparin  5,000 Units Subcutaneous 3 times per day  . nicotine   21 mg Transdermal Q24H      LOS: 1 day   Kanchan Gal M.D. [facility] 05/17/2014, 10:42 AM Pager: [phone]  If 7PM-7AM, please contact night-coverage [email or web address] Password [email or web address]  **Disclaimer: This note was dictated with voice recognition software. Similar sounding words can inadvertently be transcribed and this note may contain transcription errors which may not have been corrected upon publication of note.**

## 2014-05-17 NOTE — ED Notes (Addendum)
[place] with c/o R arm and shoulder pain, denies CP. Pt d/c from [facility] today after full cardiac workup. Per EMS pt states [facility] did nothing for him so he needs to be seen again.  Pt states he was d/c around lunchtime from [facility]. Pt states pain with contractures to R hand started  hours ago. Pt states he had same issue yesterday on L side that lead to cardiac workup and admission.  Pt states he currently homeless and is hitch hiking from [place] to [place]

## 2014-05-17 NOTE — Discharge Summary (Signed)
Physician Discharge Summary  Patient ID: Dakota Higgins MRN: [id number] DOB/AGE: February 14, 1946 68 y.o.  Admit date: 05/16/2014 Discharge date: 05/17/2014  Primary Care Physician:  No primary provider on file.  Discharge Diagnoses:    . Chest pain- atypical, likely musculoskeletal  . Tobacco abuse . Abnormal CXR (chest x-ray)  Consults:  None    Allergies:   Allergies  Allergen Reactions  . Strawberry Hives and Rash  . Tomato Hives and Rash     Discharge Medications:   Medication List         nitroGLYCERIN 0.4 MG SL tablet  Commonly known as:  NITROSTAT  Place 1 tablet (0.4 mg total) under the tongue every 5 (five) minutes as needed for chest pain.     pantoprazole 40 MG tablet  Commonly known as:  PROTONIX  Take 1 tablet (40 mg total) by mouth daily.     PRESCRIPTION MEDICATION  Take 1 tablet by mouth daily. Blood Pressure Medication     traMADol 50 MG tablet  Commonly known as:  ULTRAM  Take 1 tablet (50 mg total) by mouth every 8 (eight) hours as needed for moderate pain or severe pain.         Brief H and P: For complete details please refer to admission H and P, but in brief Dakota Higgins is a 68 y.o. male, presents with complaints of chest pain, reports he was traveling from [place] to [place] to visit his brother, he was hitchhiking , and on the had complaints of chest pain, midsternal, sharp quality, nonradiating,, associated with mild shortness of breath, reports currently pain much improved after he was given nitroglycerin, and IV morphine, he was given 324 mg of aspirin by EMS, reports had similar chest pain 3 years ago where he was admitted to [facility], and he reports he had a stress test done then, which reports it was negative, hospitalist service requested to admit the patient for further evaluation and workup, his first troponin was negative, EKG did not show acute ST or T wave abnormalities.  Patient denied any hemoptysis, cough tender, or leg  swelling.  Hospital Course:  Chest pain - Improved, atypical, only risk factor is smoking history, [facility] and [facility] that he had a stress test done which was last year and was normal. Serial troponins remained negative  EKG did not show acute ST-T wave changes of ischemia  2-D echo showed EF of 50-55% with normal wall motion, normal left ventricle diastolic function. Patient was commended to continue pain control for musculoskeletal chest pain.  Tobacco abuse - Patient placed on nicotine patch while inpatient.  Abnormal CXR (chest x-ray)  - Chest x-ray showed right paratracheal soft tissue opacity in the lung apex  - CT of the chest showed no lung mass, likely reflected a combination of medial scapular border and soft tissue fold. 1 mm left upper lobe nodule, followup chest CT in 6-12 months.   Home less: Social worker consult was obtained, patient has a bed in [facility]  Day of Discharge BP 114/62  Pulse 58  Temp(Src) 98 F (36.7 C) (Oral)  Resp 16  SpO2 99%  Physical Exam: General: Alert and awake oriented x3 not in any acute distress. HEENT: anicteric sclera, pupils reactive to light and accommodation CVS: S1-S2 clear no murmur rubs or gallops Chest: clear to auscultation bilaterally, no wheezing rales or rhonchi Abdomen: soft nontender, nondistended, normal bowel sounds Extremities: no cyanosis, clubbing or edema noted bilaterally  Neuro: Cranial nerves II-XII intact, no focal neurological deficits   The results of significant diagnostics from this hospitalization (including imaging, microbiology, ancillary and laboratory) are listed below for reference.    LAB RESULTS: Basic Metabolic Panel:  Recent Labs Lab 05/16/14 1235  NA 142  K 3.8  CL 103  CO2 28  GLUCOSE 89  BUN 15  CREATININE 0.96  CALCIUM 8.6   Liver Function Tests:  Recent Labs Lab 05/16/14 1235  AST 16  ALT 11  ALKPHOS 66  BILITOT <0.2*  PROT 6.7  ALBUMIN 3.5    No results found for this basename: LIPASE, AMYLASE,  in the last 168 hours No results found for this basename: AMMONIA,  in the last 168 hours CBC:  Recent Labs Lab 05/16/14 1235  WBC 6.9  HGB 13.4  HCT 38.7*  MCV 96.3  PLT 291   Cardiac Enzymes:  Recent Labs Lab 05/17/14 0056 05/17/14 0700  TROPONINI <0.30 <0.30   BNP: No components found with this basename: POCBNP,  CBG: No results found for this basename: GLUCAP,  in the last 168 hours  Significant Diagnostic Studies:  Ct Chest W Contrast  05/16/2014   CLINICAL DATA:  Evaluate for possible mass seen on chest radiograph.  EXAM: CT CHEST WITH CONTRAST  TECHNIQUE: Multidetector CT imaging of the chest was performed during intravenous contrast administration.  CONTRAST:  75mL OMNIPAQUE IOHEXOL 300 MG/ML  SOLN  COMPARISON:  Current chest radiograph.  FINDINGS: There is no lung mass or abnormality to correspond to the shadow on the current chest radiograph. The shadow likely reflected a combination of a soft tissue fold and the medial scapular border.  5 mm nodule lies in the left upper lobe on image 29, series 202. Minor pleural parenchymal scarring at the apices. Lungs are otherwise clear. No pleural effusion.  Heart is normal in size and configuration. Great vessels are normal in caliber. Mild atherosclerotic plaque noted along thoracic aorta. No mediastinal or hilar masses. No adenopathy. No neck base or axillary masses or adenopathy.  Limited evaluation of the upper abdomen demonstrates a stomach moderately distended with debris but is otherwise unremarkable.  There are mild degenerative changes throughout the thoracic spine. No osteoblastic or osteolytic lesions.  IMPRESSION: 1. No lung mass. The abnormality on the current chest radiograph likely reflected a combination of the medial scapular border and a soft tissue fold. 2. I mm left upper lobe nodule. If the patient is at high risk for bronchogenic carcinoma, follow-up chest CT  at 6-12 months is recommended. If the patient is at low risk for bronchogenic carcinoma, follow-up chest CT at 12 months is recommended. This recommendation follows the consensus statement: Guidelines for Management of Small Pulmonary Nodules Detected on CT Scans: A Statement from the Fleischner Society as published in Radiology 2005;237:395-400. 3. Lungs otherwise clear. 4. Stomach moderately distended with ingested material.   Electronically Signed   By: Amie Portland M.D.   On: 05/16/2014 20:09   Dg Chest Portable 1 View  05/16/2014   CLINICAL DATA:  Numbness, chest pain  EXAM: PORTABLE CHEST - 1 VIEW  COMPARISON:  None.  FINDINGS: Cardiac and mediastinal contours are within normal limits. Prominence of the right paratracheal soft tissues with focal opacity in the right apex. No focal airspace consolidation, pleural effusion or pneumothorax. No evidence of pulmonary edema. No acute osseous abnormality. Geographic opacity in the right mid to upper lung zone with well-defined in angulated margins favored to reflect artifact related to the  presence of skin folds  IMPRESSION: 1. No acute cardiopulmonary process. 2. Right paratracheal soft tissue opacity in the lung apex favored to reflect prominent underlying vascular structures. However, a right apical pulmonary mass/nodule is difficult to exclude entirely. Consider further evaluation with nonemergent CT scan of the chest with contrast.   Electronically Signed   By: Malachy Moan M.D.   On: 05/16/2014 13:18    2D ECHO: Study Conclusions  - Left ventricle: The cavity size was normal. Systolic function was normal. The estimated ejection fraction was in the range of 50% to 55%. Wall motion was normal; there were no regional wall motion abnormalities. Left ventricular diastolic function parameters were normal.     Disposition and Follow-up:     Discharge Instructions   Diet - low sodium heart healthy    Complete by:  As directed      Increase  activity slowly    Complete by:  As directed             DISPOSITION: Home  DIET: Heart healthy diet    DISCHARGE FOLLOW-UP Follow-up Information   Schedule an appointment as soon as possible for a visit in 2 weeks to follow up. (for hospital follow-up)    Contact information:   [facility]      Time spent on Discharge: 35 mins  Signed:   Jala Higgins M.D. [facility] 05/17/2014, 12:59 PM Pager: [phone]   **Disclaimer: This note was dictated with voice recognition software. Similar sounding words can inadvertently be transcribed and this note may contain transcription errors which may not have been corrected upon publication of note.**

## 2014-05-17 NOTE — Clinical Social Work Psychosocial (Signed)
Clinical Social Work Department BRIEF PSYCHOSOCIAL ASSESSMENT 05/17/2014  Patient:  Dakota Higgins, Dakota Higgins     Account Number:  [id number]     Admit date:  05/16/2014  Clinical Social Worker:  Hubert Azure  Date/Time:  05/17/2014 03:49 PM  Referred by:  Physician  Date Referred:  05/17/2014 Referred for  Homelessness   Other Referral:   Interview type:  Patient Other interview type:    PSYCHOSOCIAL DATA Living Status:  OTHER Admitted from facility:   Level of care:   Primary support name:  Flynn Gwyn Primary support relationship to patient:  FAMILY Degree of support available:   [place]. Earsel Shouse is patient's sister in law.    CURRENT CONCERNS Current Concerns  Other - See comment   Other Concerns:   Homelessness    SOCIAL WORK ASSESSMENT / PLAN CSW met with patient who was alert and oriented x4. CSW introduced self and explained role. CSW discussed living situation with patient. Per patient, he has resided in [place] for 15 years and was on his way back to [place] from residing with his brother and sister in [place] for 3 weeks. Patient stated he cannot go back to brother's home as the children are back home. Per patient, he was hitchhiking back to [place] when incident occurred. CSW asked patient about [facility], patient replied, "I'm going back to [place]." CSW made patient aware [facility] has a bed for him for tonight. Patient continued to state, "I'm hitchhiking back to [place] where I know and been for 15 years."    CSW discussed health risks associated with walking long distances and safety risks associated with sleeping in unknown places. Patient verbalized understanding. Patient RN, Mickel Baas aware of the above.    Assessment/plan status:  Other - See comment Other assessment/ plan:   CSW provided patient with bus pass.   Information/referral to community resources:    PATIENT'S/FAMILY'S RESPONSE TO PLAN OF CARE: Patient was adamant about  returning to [place] although bed available at [facility]   [place], [place] Weekend Clinical Social Worker [phone]

## 2014-05-17 NOTE — Progress Notes (Signed)
  Echocardiogram 2D Echocardiogram has been performed.  Golden Gilreath FRANCES 05/17/2014, 11:43 AM

## 2014-05-18 ENCOUNTER — Emergency Department (HOSPITAL_COMMUNITY)
Admission: EM | Admit: 2014-05-18 | Discharge: 2014-05-18 | Disposition: A | Discharge status: Home / Self Care | Payer: Medicare Other | Attending: Emergency Medicine | Admitting: Emergency Medicine

## 2014-05-18 ENCOUNTER — Encounter (HOSPITAL_COMMUNITY): Payer: Self-pay | Admitting: Emergency Medicine

## 2014-05-18 DIAGNOSIS — R0789 Other chest pain: Secondary | ICD-10-CM | POA: Insufficient documentation

## 2014-05-18 DIAGNOSIS — R079 Chest pain, unspecified: Secondary | ICD-10-CM

## 2014-05-18 DIAGNOSIS — K219 Gastro-esophageal reflux disease without esophagitis: Secondary | ICD-10-CM | POA: Insufficient documentation

## 2014-05-18 DIAGNOSIS — G5631 Lesion of radial nerve, right upper limb: Secondary | ICD-10-CM

## 2014-05-18 DIAGNOSIS — G5630 Lesion of radial nerve, unspecified upper limb: Secondary | ICD-10-CM | POA: Insufficient documentation

## 2014-05-18 DIAGNOSIS — F1021 Alcohol dependence, in remission: Secondary | ICD-10-CM | POA: Insufficient documentation

## 2014-05-18 DIAGNOSIS — Z8739 Personal history of other diseases of the musculoskeletal system and connective tissue: Secondary | ICD-10-CM | POA: Insufficient documentation

## 2014-05-18 DIAGNOSIS — F17200 Nicotine dependence, unspecified, uncomplicated: Secondary | ICD-10-CM | POA: Insufficient documentation

## 2014-05-18 LAB — BASIC METABOLIC PANEL
ANION GAP: 11 (ref 5–15)
BUN: 13 mg/dL (ref 6–23)
CHLORIDE: 101 meq/L (ref 96–112)
CO2: 29 mEq/L (ref 19–32)
Calcium: 9 mg/dL (ref 8.4–10.5)
Creatinine, Ser: 0.74 mg/dL (ref 0.50–1.35)
GFR calc non Af Amer: >90 mL/min (ref >90)
Glucose, Bld: 119 mg/dL — ABNORMAL HIGH (ref 70–99)
Potassium: 3.5 mEq/L — ABNORMAL LOW (ref 3.7–5.3)
Sodium: 141 mEq/L (ref 137–147)

## 2014-05-18 LAB — CBC
HEMATOCRIT: 40 % (ref 39.0–52.0)
Hemoglobin: 13.7 g/dL (ref 13.0–17.0)
MCH: 32.9 pg (ref 26.0–34.0)
MCHC: 34.3 g/dL (ref 30.0–36.0)
MCV: 96.2 fL (ref 78.0–100.0)
PLATELETS: 254 10*3/uL (ref 150–400)
RBC: 4.16 MIL/uL — ABNORMAL LOW (ref 4.22–5.81)
RDW: 13.9 % (ref 11.5–15.5)
WBC: 7 10*3/uL (ref 4.0–10.5)

## 2014-05-18 LAB — I-STAT TROPONIN, ED: Troponin i, poc: 0 ng/mL (ref 0.00–0.08)

## 2014-05-18 NOTE — Discharge Instructions (Signed)
Chest Pain (Nonspecific) °It is often hard to give a specific diagnosis for the cause of chest pain. There is always a chance that your pain could be related to something serious, such as a heart attack or a blood clot in the lungs. You need to follow up with your health care provider for further evaluation. °CAUSES  °· Heartburn. °· Pneumonia or bronchitis. °· Anxiety or stress. °· Inflammation around your heart (pericarditis) or lung (pleuritis or pleurisy). °· A blood clot in the lung. °· A collapsed lung (pneumothorax). It can develop suddenly on its own (spontaneous pneumothorax) or from trauma to the chest. °· Shingles infection (herpes zoster virus). °The chest wall is composed of bones, muscles, and cartilage. Any of these can be the source of the pain. °· The bones can be bruised by injury. °· The muscles or cartilage can be strained by coughing or overwork. °· The cartilage can be affected by inflammation and become sore (costochondritis). °DIAGNOSIS  °Lab tests or other studies may be needed to find the cause of your pain. Your health care provider may have you take a test called an ambulatory electrocardiogram (ECG). An ECG records your heartbeat patterns over a 24-hour period. You may also have other tests, such as: °· Transthoracic echocardiogram (TTE). During echocardiography, sound waves are used to evaluate how blood flows through your heart. °· Transesophageal echocardiogram (TEE). °· Cardiac monitoring. This allows your health care provider to monitor your heart rate and rhythm in real time. °· Holter monitor. This is a portable device that records your heartbeat and can help diagnose heart arrhythmias. It allows your health care provider to track your heart activity for several days, if needed. °· Stress tests by exercise or by giving medicine that makes the heart beat faster. °TREATMENT  °· Treatment depends on what may be causing your chest pain. Treatment may include: °¨ Acid blockers for  heartburn. °¨ Anti-inflammatory medicine. °¨ Pain medicine for inflammatory conditions. °¨ Antibiotics if an infection is present. °· You may be advised to change lifestyle habits. This includes stopping smoking and avoiding alcohol, caffeine, and chocolate. °· You may be advised to keep your head raised (elevated) when sleeping. This reduces the chance of acid going backward from your stomach into your esophagus. °Most of the time, nonspecific chest pain will improve within 2-3 days with rest and mild pain medicine.  °HOME CARE INSTRUCTIONS  °· If antibiotics were prescribed, take them as directed. Finish them even if you start to feel better. °· For the next few days, avoid physical activities that bring on chest pain. Continue physical activities as directed. °· Do not use any tobacco products, including cigarettes, chewing tobacco, or electronic cigarettes. °· Avoid drinking alcohol. °· Only take medicine as directed by your health care provider. °· Follow your health care provider's suggestions for further testing if your chest pain does not go away. °· Keep any follow-up appointments you made. If you do not go to an appointment, you could develop lasting (chronic) problems with pain. If there is any problem keeping an appointment, call to reschedule. °SEEK MEDICAL CARE IF:  °· Your chest pain does not go away, even after treatment. °· You have a rash with blisters on your chest. °· You have a fever. °SEEK IMMEDIATE MEDICAL CARE IF:  °· You have increased chest pain or pain that spreads to your arm, neck, jaw, back, or abdomen. °· You have shortness of breath. °· You have an increasing cough, or you cough   up blood.  You have severe back or abdominal pain.  You feel nauseous or vomit.  You have severe weakness.  You faint.  You have chills. This is an emergency. Do not wait to see if the pain will go away. Get medical help at once. Call your local emergency services (911 in [place] Do not drive  yourself to the hospital. MAKE SURE YOU:   Understand these instructions.  Will watch your condition.  Will get help right away if you are not doing well or get worse. Document Released: 05/25/2005 Document Revised: 08/20/2013 Document Reviewed: 03/20/2008 [facility] Patient Information 2015 [place], [place]. This information is not intended to replace advice given to you by your health care provider. Make sure you discuss any questions you have with your health care provider.  Radial Nerve Palsy Wrist drop is also known as radial nerve palsy. It is a condition in which you can not extend your wrist. This means if you are standing with your elbow bent at a right angle and with the top of your hand pointed at the ceiling, you can not hold your hand up. It falls toward the floor.  This action of extending your wrist is caused by the muscles in the back of your arm. These muscles are controlled by the radial nerve. This means that anything affecting the radial nerve so it can not tell the muscles how to work will cause wrist drop. This is medically called radial nerve palsy. Also the radial nerve is a motor and sensory nerve so anything affecting it causes problems with movement and feeling. CAUSES  Some more common causes of wrist drop are:  A break (fracture) of the large bone in the arm between your shoulder and your elbow (humerus). This is because the radial nerve winds around the humerus.  Improper use of crutches causes this because the radial nerve runs through the armpit (axilla). Crutches which are too long can put pressure on the nerve. This is sometimes called crutch palsy.  Falling asleep with your arm over a chair and supported on the back is a common cause. This is sometimes called Saturday Night Syndrome.  Wrist drop can be associated with lead poisoning because of the effect of lead on the radial nerve. SYMPTOMS  The wrist drop is an obvious problem, but there may also be numbness  in the back of the arm, forearm or hand which provides feeling in these areas by the radial nerve. There can be difficulty straightening out the elbow in addition to the wrist. There may be numbness, tingling, pain, burning sensations or other abnormal feelings. Symptoms depend entirely on where the radial nerve is injured. DIAGNOSIS   Wrist drop is obvious just by looking at it. Your caregiver may make the diagnosis by taking your history and doing a couple tests.  One test which may be done is a nerve conduction study. This test shows if the radial nerve is conducting signals well. If not, it can determine where the nerve problem is.  Sometimes X-ray studies are done. Your caregiver will determine if further testing needs to be done. TREATMENT   Usually if the problem is found to be pressure on the nerve, simply removing the pressure will allow the nerve to go back to normal in a few weeks to a few months. Other treatments will depend upon the cause found.  Only take over-the-counter or prescription medicines for pain, discomfort, or fever as directed by your caregiver.  Sometimes seizure medications  are used.  Steroids are sometimes given to decrease swelling if it is thought to be a possible cause. Document Released: 04/21/2006 Document Revised: 11/07/2011 Document Reviewed: 10/30/2013 [facility] Patient Information 2015 [place], [place]. This information is not intended to replace advice given to you by your health care provider. Make sure you discuss any questions you have with your health care provider.

## 2014-05-18 NOTE — ED Provider Notes (Signed)
CSN: [id number]     Arrival date & time 05/18/14  0230 History   First MD Initiated Contact with Patient 05/18/14 0815     Chief Complaint  Patient presents with  . Chest Pain     (Consider location/radiation/quality/duration/timing/severity/associated sxs/prior Treatment) Patient is a 68 y.o. male presenting with chest pain. The history is provided by the patient.  Chest Pain Pain location:  R chest Associated symptoms: fatigue, numbness and weakness   Associated symptoms: no back pain    patient presents with his third visit in 2 days, including an admission. He was admitted for chest pain at [facility] and had a rule out. He was seen earlier in the night with right shoulder pain and weakness in his right hand. Diagnosed with radial neuropathy. He now returns again. He reported he had right-sided chest pain. States weakness in his right hand and he hurts all over. States he has weakness in both of his legs. Initially states that his wrist drop and weakness in the hand started 2 hours prior to this visit, but he was here for a visit for the same earlier today. On further questioning states that he has chronic weakness in that hand. States is from an old basketball injury. He is reportedly hitchhiking from [place] to [place].  Past Medical History  Diagnosis Date  . Alcoholism   . GERD (gastroesophageal reflux disease)   . Arthritis     KNEES & ANKLE   Past Surgical History  Procedure Laterality Date  . Neck surgery    . Neck surgery     No family history on file. History  Substance Use Topics  . Smoking status: Current Every Day Smoker -- 1.00 packs/day for 46 years    Types: Cigarettes  . Smokeless tobacco: Never Used  . Alcohol Use: Yes     Comment: daily beer and occasional liquor    Review of Systems  Constitutional: Positive for fatigue.  Respiratory: Negative for chest tightness.   Cardiovascular: Positive for chest pain.  Musculoskeletal: Positive for myalgias.  Negative for back pain.  Neurological: Positive for weakness and numbness.      Allergies  Strawberry and Tomato  Home Medications   Prior to Admission medications   Medication Sig Start Date End Date Taking? Authorizing Provider  pantoprazole (PROTONIX) 40 MG tablet Take 1 tablet (40 mg total) by mouth daily. 05/17/14  Yes Ripudeep K Rai, MD   BP 157/72  Pulse 79  Temp(Src) 98.6 F (37 C) (Oral)  Resp 18  Ht  (1.753 m)  Wt 140 lb (63.504 kg)  BMI 20.67 kg/m2  SpO2 100% Physical Exam  Constitutional: He is oriented to person, place, and time. He appears well-developed and well-nourished.  HENT:  Head: Normocephalic and atraumatic.  Cardiovascular: Normal rate and regular rhythm.   Pulmonary/Chest: Effort normal and breath sounds normal.  Abdominal: Soft. There is no tenderness.  Musculoskeletal: Normal range of motion.  Neurological: He is alert and oriented to person, place, and time. No cranial nerve deficit.  Patient with brace on right wrist. Upon removal he does have a wrist drop on the right side. Does apparently have flexion at the wrist, but had difficulty with extension. Patient will not do much with his hand, although there appears be an effort component to it. States paresthesias over entire hand. No tenderness in shoulder. No axillary swelling. No neck tenderness.  Skin: Skin is warm.    ED Course  Procedures (including critical care time)  Labs Review Labs Reviewed  CBC - Abnormal; Notable for the following:    RBC 4.16 (*)    All other components within normal limits  BASIC METABOLIC PANEL - Abnormal; Notable for the following:    Potassium 3.5 (*)    Glucose, Bld 119 (*)    All other components within normal limits  I-STAT TROPOININ, ED    Imaging Review Dg Shoulder Right  05/17/2014   CLINICAL DATA:  Right shoulder pain, radiating to the right fingers.  EXAM: RIGHT SHOULDER - 2+ VIEW  COMPARISON:  None.  FINDINGS: There is no evidence of  fracture or dislocation. The right humeral head is seated within the glenoid fossa. The acromioclavicular joint is unremarkable in appearance. No significant soft tissue abnormalities are seen. The visualized portions of the right lung are clear.  IMPRESSION: No evidence of fracture or dislocation.   Electronically Signed   By: Roanna Raider M.D.   On: 05/17/2014 23:11   Ct Chest W Contrast  05/16/2014   CLINICAL DATA:  Evaluate for possible mass seen on chest radiograph.  EXAM: CT CHEST WITH CONTRAST  TECHNIQUE: Multidetector CT imaging of the chest was performed during intravenous contrast administration.  CONTRAST:  75mL OMNIPAQUE IOHEXOL 300 MG/ML  SOLN  COMPARISON:  Current chest radiograph.  FINDINGS: There is no lung mass or abnormality to correspond to the shadow on the current chest radiograph. The shadow likely reflected a combination of a soft tissue fold and the medial scapular border.  5 mm nodule lies in the left upper lobe on image 29, series 202. Minor pleural parenchymal scarring at the apices. Lungs are otherwise clear. No pleural effusion.  Heart is normal in size and configuration. Great vessels are normal in caliber. Mild atherosclerotic plaque noted along thoracic aorta. No mediastinal or hilar masses. No adenopathy. No neck base or axillary masses or adenopathy.  Limited evaluation of the upper abdomen demonstrates a stomach moderately distended with debris but is otherwise unremarkable.  There are mild degenerative changes throughout the thoracic spine. No osteoblastic or osteolytic lesions.  IMPRESSION: 1. No lung mass. The abnormality on the current chest radiograph likely reflected a combination of the medial scapular border and a soft tissue fold. 2. I mm left upper lobe nodule. If the patient is at high risk for bronchogenic carcinoma, follow-up chest CT at 6-12 months is recommended. If the patient is at low risk for bronchogenic carcinoma, follow-up chest CT at 12 months is  recommended. This recommendation follows the consensus statement: Guidelines for Management of Small Pulmonary Nodules Detected on CT Scans: A Statement from the Fleischner Society as published in Radiology 2005;237:395-400. 3. Lungs otherwise clear. 4. Stomach moderately distended with ingested material.   Electronically Signed   By: Amie Portland M.D.   On: 05/16/2014 20:09   Dg Chest Portable 1 View  05/16/2014   CLINICAL DATA:  Numbness, chest pain  EXAM: PORTABLE CHEST - 1 VIEW  COMPARISON:  None.  FINDINGS: Cardiac and mediastinal contours are within normal limits. Prominence of the right paratracheal soft tissues with focal opacity in the right apex. No focal airspace consolidation, pleural effusion or pneumothorax. No evidence of pulmonary edema. No acute osseous abnormality. Geographic opacity in the right mid to upper lung zone with well-defined in angulated margins favored to reflect artifact related to the presence of skin folds  IMPRESSION: 1. No acute cardiopulmonary process. 2. Right paratracheal soft tissue opacity in the lung apex favored to reflect prominent underlying vascular structures.  However, a right apical pulmonary mass/nodule is difficult to exclude entirely. Consider further evaluation with nonemergent CT scan of the chest with contrast.   Electronically Signed   By: Malachy Moan M.D.   On: 05/16/2014 13:18     EKG Interpretation   Date/Time:  Sunday May 18 2014 03:09:05 EDT Ventricular Rate:  63 PR Interval:  126 QRS Duration: 106 QT Interval:  433 QTC Calculation: 443 R Axis:   68 Text Interpretation:  Sinus rhythm Anteroseptal infarct, old inferior  baseline wander Baseline wander in lead(s) V5 Confirmed by Subrina Vecchiarelli  MD,  Yaquelin Langelier [phone]) on 05/18/2014 8:17:07 AM      MDM   Final diagnoses:  Radial nerve dysfunction, right  Chest pain, unspecified chest pain type    Patient with chest pain. EKG reassuring and has had recent admission for same. Also  with right arm neuro deficits. Patient initially stated that it was new for this visit, but had a visit to the ER 3 hours prior to this one for the same complaint. Upon further questioning states she's had it for a long time. He is hitchhiking from [place] to refill. Will discharge    Juliet Rude. Rubin Payor, MD 05/18/14 [phone]

## 2014-05-18 NOTE — ED Notes (Signed)
Pt presents with R sided CP onset 20 min ago with radiation to R arm, describes as numbness/tingling.  Pt seen at this facility and at [facility] in last 24hours. Full cardiac workup completed at [facility].

## 2014-05-20 NOTE — ED Provider Notes (Signed)
CSN: [id number]     Arrival date & time 05/17/14  1928 History   First MD Initiated Contact with Patient 05/17/14 2210     Chief Complaint  Patient presents with  . Shoulder Pain      HPI Patient presents with complaints of wrist drop.  He states is unable to extend his right wrist.  He states this has been present over the past several hours.  He states he had some numbness and tingling in his hand yesterday.  He was in the emergency department at [facility] for many hours yesterday.  He reports some radiation of discomfort and pain up into his right upper arm.  He denies new swelling of his right upper extremity.  He denies history of vascular disease.  He denies active anterior chest pain.  He seen yesterday at [facility] for cardiac workup.  He is currently homeless and is working on getting towards [employer].  He's been hitchhiking for several days from [place].  No fevers or chills.  No recent trauma to his right upper extremity.  No shortness of breath.  No fevers or chills    Past Medical History  Diagnosis Date  . Alcoholism   . GERD (gastroesophageal reflux disease)   . Arthritis     KNEES & ANKLE   Past Surgical History  Procedure Laterality Date  . Neck surgery    . Neck surgery     No family history on file. History  Substance Use Topics  . Smoking status: Current Every Day Smoker -- 1.00 packs/day for 46 years    Types: Cigarettes  . Smokeless tobacco: Never Used  . Alcohol Use: Yes     Comment: daily beer and occasional liquor    Review of Systems  All other systems reviewed and are negative.     Allergies  Strawberry and Tomato  Home Medications   Prior to Admission medications   Medication Sig Start Date End Date Taking? Authorizing Provider  pantoprazole (PROTONIX) 40 MG tablet Take 1 tablet (40 mg total) by mouth daily. 05/17/14   Ripudeep Jenna Luo, MD   BP 126/78  Pulse 72  Temp(Src) 97.7 F (36.5 C) (Oral)  Resp 16   Ht  (1.753 m)  Wt 140 lb (63.504 kg)  BMI 20.67 kg/m2  SpO2 99% Physical Exam  Nursing note and vitals reviewed. Constitutional: He is oriented to person, place, and time. He appears well-developed and well-nourished.  HENT:  Head: Normocephalic and atraumatic.  Eyes: EOM are normal.  Neck: Normal range of motion.  Cardiovascular: Normal rate, regular rhythm, normal heart sounds and intact distal pulses.   Pulmonary/Chest: Effort normal and breath sounds normal. No respiratory distress.  Abdominal: Soft. He exhibits no distension. There is no tenderness.  Musculoskeletal: Normal range of motion.  Full range of motion of right shoulder and right elbow.  Normal passive range of motion of right wrist.  Patient unable to extend right wrist.  Normal strength in both his biceps triceps and deltoid muscles.  Normal right radial pulse.  No swelling of the right upper extremity as compared to the left  Neurological: He is alert and oriented to person, place, and time.  Skin: Skin is warm and dry.  Psychiatric: He has a normal mood and affect. Judgment normal.    ED Course  Procedures (including critical care time) Labs Review Labs Reviewed - No data to display  Imaging Review No results found.   EKG Interpretation  Date/Time:  Saturday May 17 2014 20:57:36 EDT Ventricular Rate:  80 PR Interval:  113 QRS Duration: 83 QT Interval:  397 QTC Calculation: 458 R Axis:   81 Text Interpretation:  Sinus rhythm Borderline short PR interval Borderline  right axis deviation No significant change was found Confirmed by Edder Bellanca   MD, Gwenetta Devos ([id number]) on 05/18/2014 1:42:28 AM      MDM   Final diagnoses:  Mononeuropathy of right radial nerve    His presentation appears to be isolated mononeuropathy.  I suspect this has been present for longer than he is letting on.  This is not a presentation of cardiac disease or stroke.  This is isolated at the right wrist.  He'll be placed in a  cockup wrist splint.  He has normal right radial pulse therefore this is not a vascular issue.  Nothing to suggest DVT at this time.    Lyanne Co, MD 05/20/14 [phone]

## 2016-04-05 IMAGING — CT CT CHEST W/ CM
1 series · 15 of 33 positions shown, 19 images · IV contrast (Iodine)
Comparison: Current chest radiograph.

CLINICAL DATA: Evaluate for possible mass seen on chest radiograph.

EXAM:
CT CHEST WITH CONTRAST
TECHNIQUE: Multidetector CT imaging of the chest was performed during
intravenous contrast administration.
CONTRAST:  75mL OMNIPAQUE IOHEXOL 300 MG/ML  SOLN

[Series 201: chest with, idose (2) · axial · 0.68mm/px · z∈[-595,-305]mm · 15 of 70 slices shown, 19 images]
[im 6/70  mediastinal]
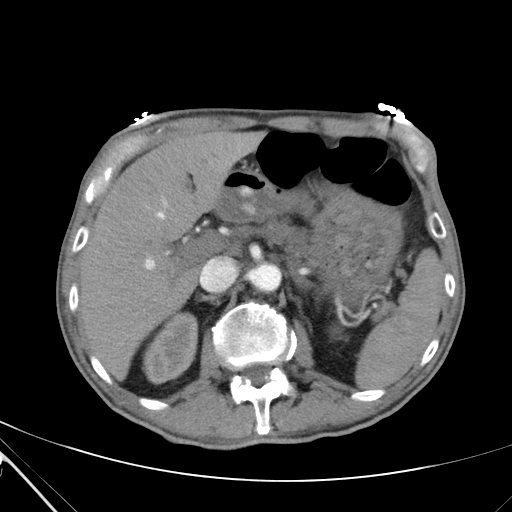
[im 6/70  lung]
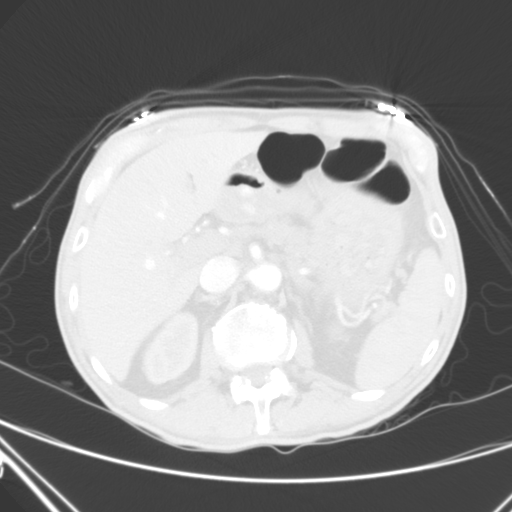
[im 11/70  lung]
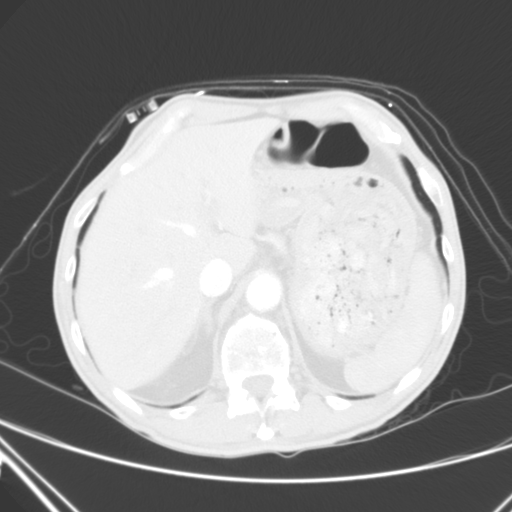
[im 14/70  lung]
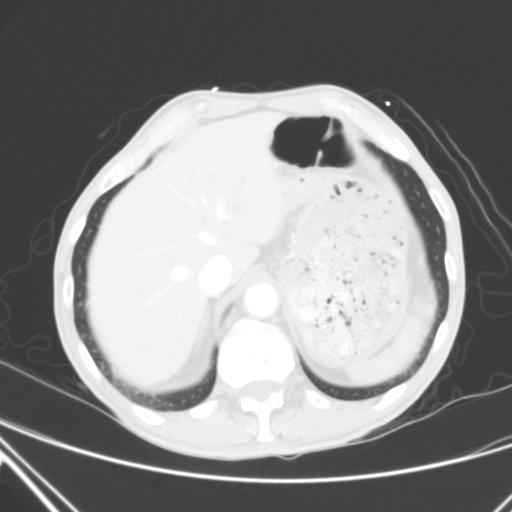
[im 18/70  lung]
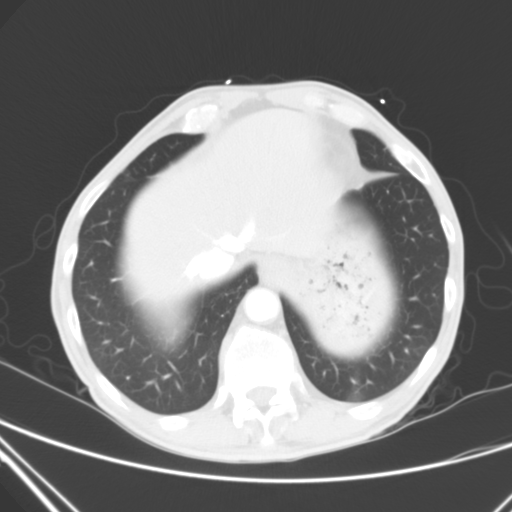
[im 24/70  mediastinal]
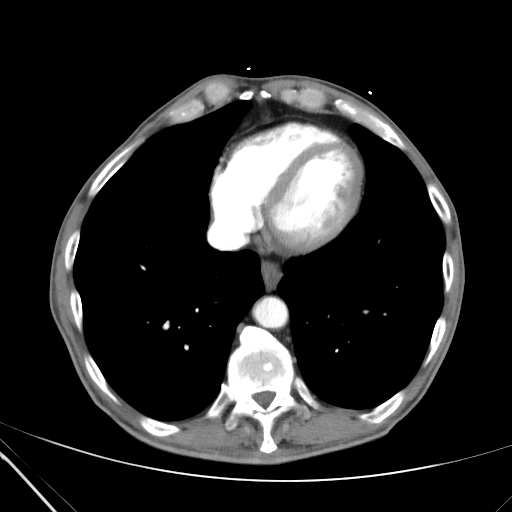
[im 24/70  lung]
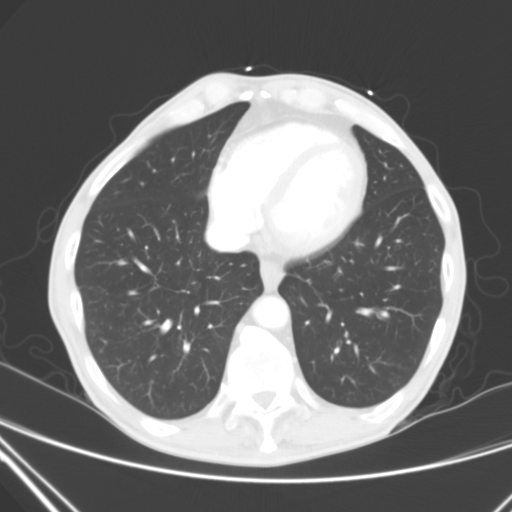
[im 28/70  lung]
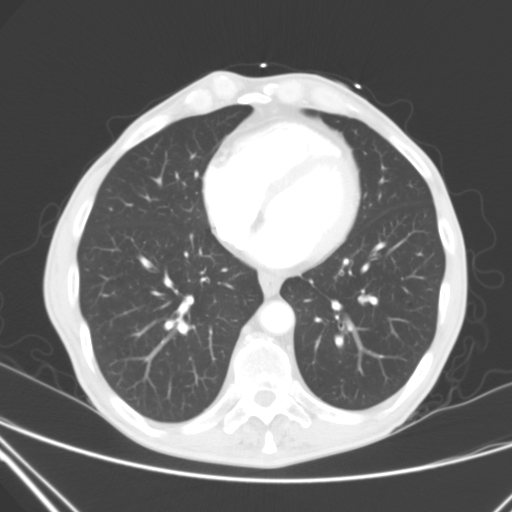
[im 31/70  lung]
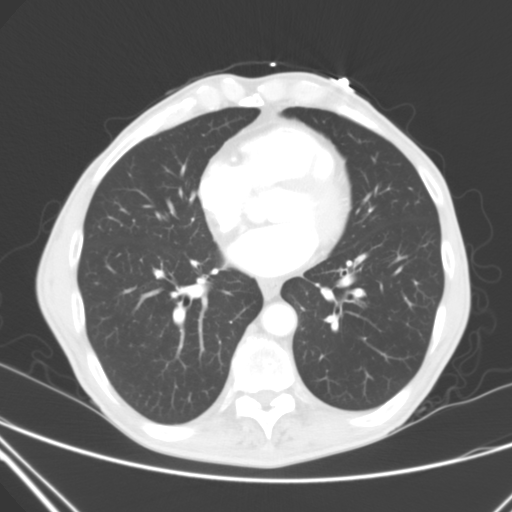
[im 36/70  lung]
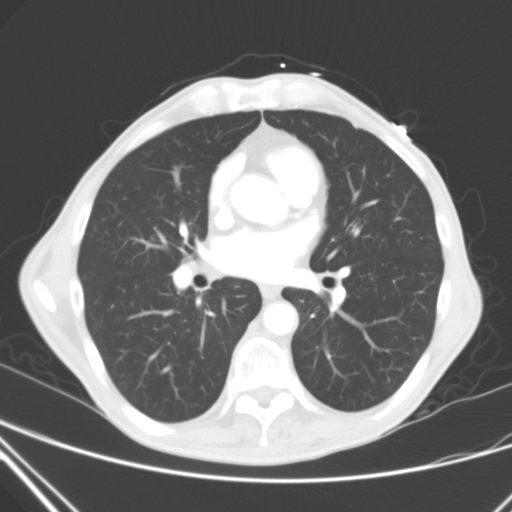
[im 39/70  mediastinal]
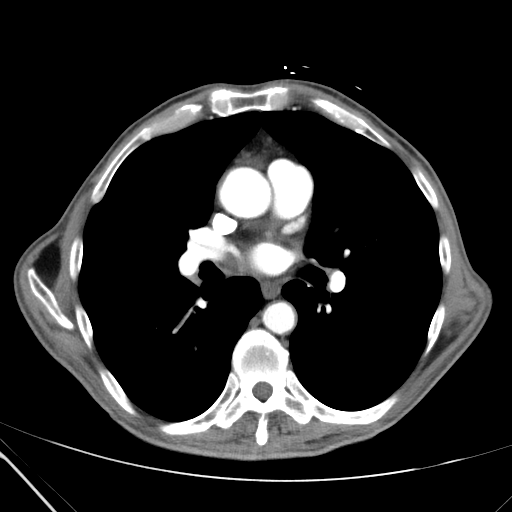
[im 39/70  lung]
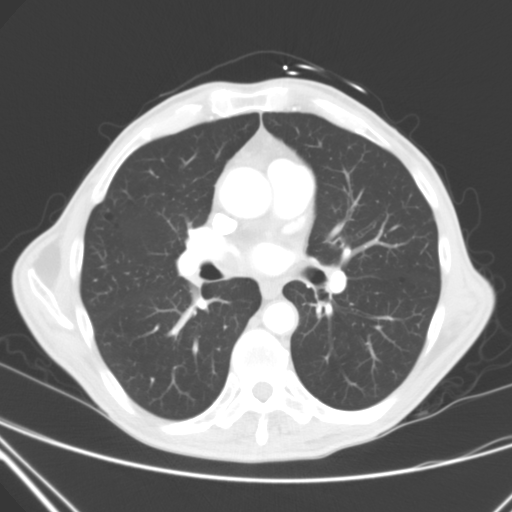
[im 42/70  lung]
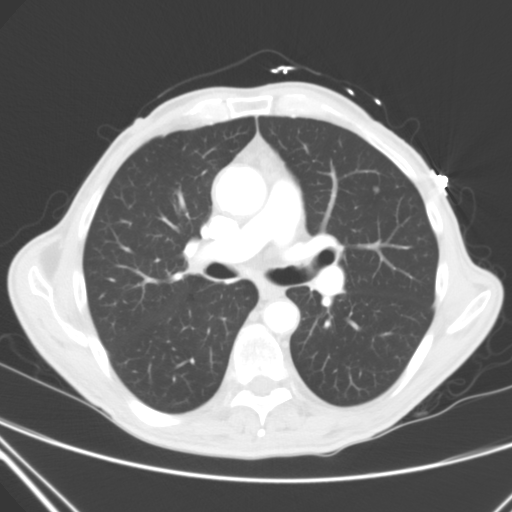
[im 47/70  lung]
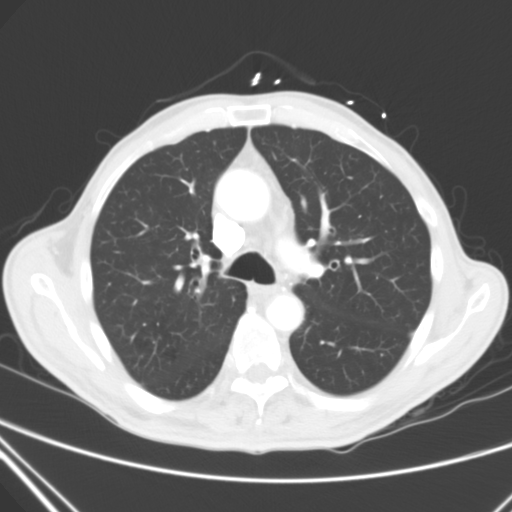
[im 52/70  lung]
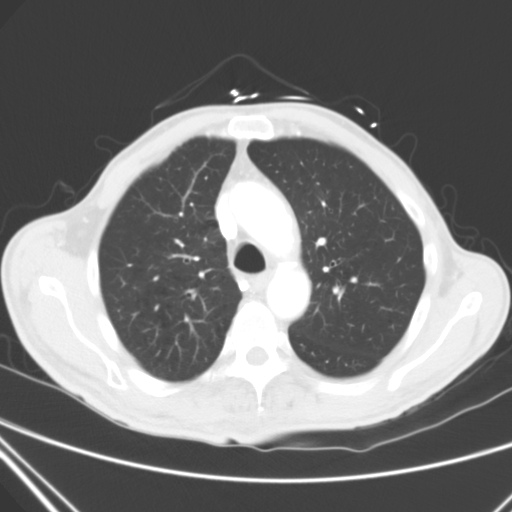
[im 56/70  mediastinal]
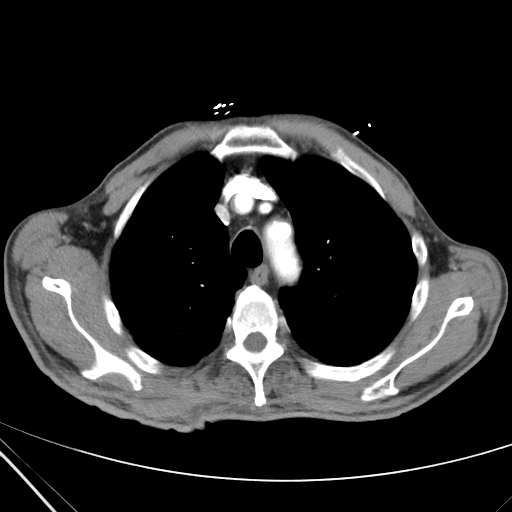
[im 56/70  lung]
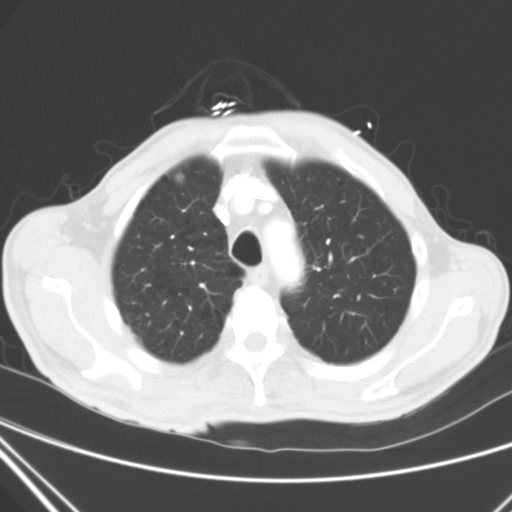
[im 59/70  lung]
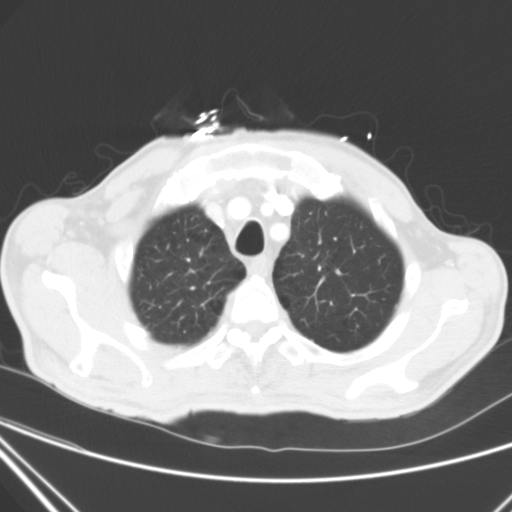
[im 64/70  lung]
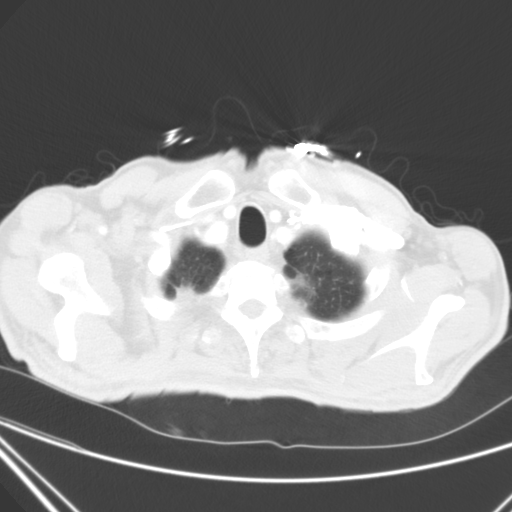

[15 of 33 positions shown; findings below may reference images not displayed]

FINDINGS: There is no lung mass or abnormality to correspond to the shadow on
the current chest radiograph. The shadow likely reflected a
combination of a soft tissue fold and the medial scapular border.

5 mm nodule lies in the left upper lobe on image 29, series 202.
Minor pleural parenchymal scarring at the apices. Lungs are
otherwise clear. No pleural effusion.

Heart is normal in size and configuration. Great vessels are normal
in caliber. Mild atherosclerotic plaque noted along thoracic aorta.
No mediastinal or hilar masses. No adenopathy. No neck base or
axillary masses or adenopathy.

Limited evaluation of the upper abdomen demonstrates a stomach
moderately distended with debris but is otherwise unremarkable.

There are mild degenerative changes throughout the thoracic spine.
No osteoblastic or osteolytic lesions.
IMPRESSION: 1. No lung mass. The abnormality on the current chest radiograph
likely reflected a combination of the medial scapular border and a
soft tissue fold.
2. I mm left upper lobe nodule. If the patient is at high risk for
bronchogenic carcinoma, follow-up chest CT at 6-12 months is
recommended. If the patient is at low risk for bronchogenic
carcinoma, follow-up chest CT at 12 months is recommended. This
recommendation follows the consensus statement: Guidelines for
Management of Small Pulmonary Nodules Detected on CT Scans: A
Statement from the [HOSPITAL] as published in Radiology
4553;[DATE].
3. Lungs otherwise clear.
4. Stomach moderately distended with ingested material.

## 2016-04-06 IMAGING — CR DG SHOULDER 2+V*R*
3 series · 3 of 3 positions shown · non-contrast
Comparison: None.

CLINICAL DATA: Right shoulder pain, radiating to the right fingers.

EXAM:
RIGHT SHOULDER - 2+ VIEW

[w shoulder internal right]
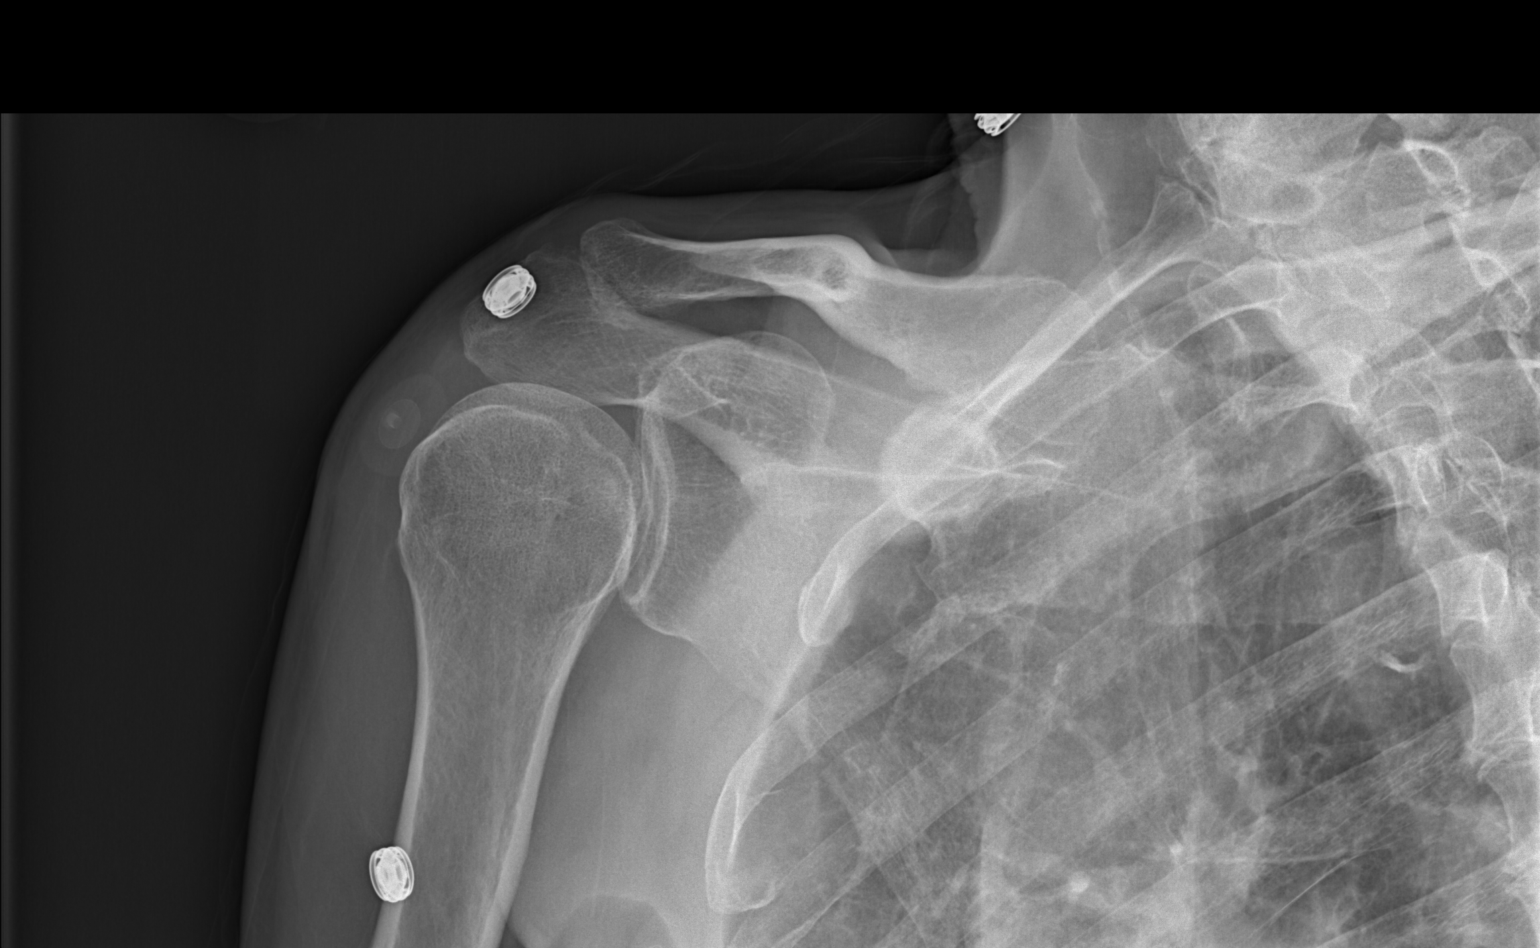

[w shoulder y-view right]
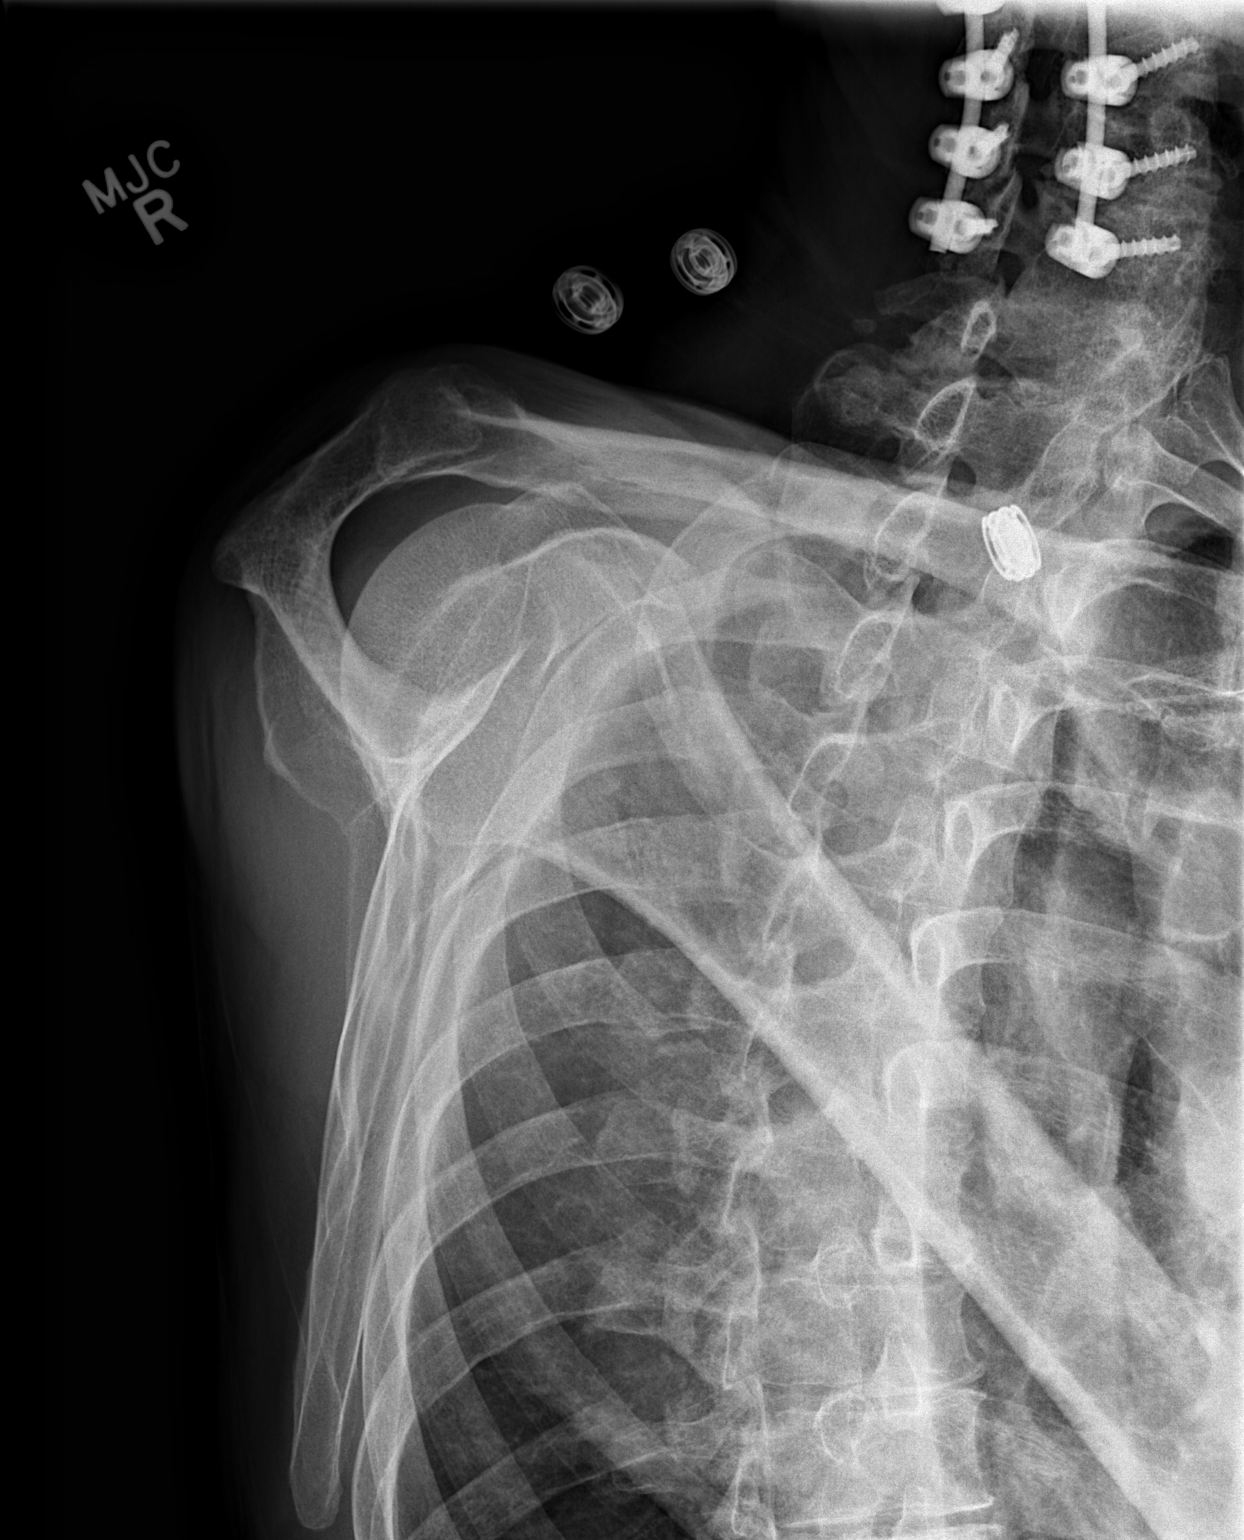

[x shoulder axillary right]
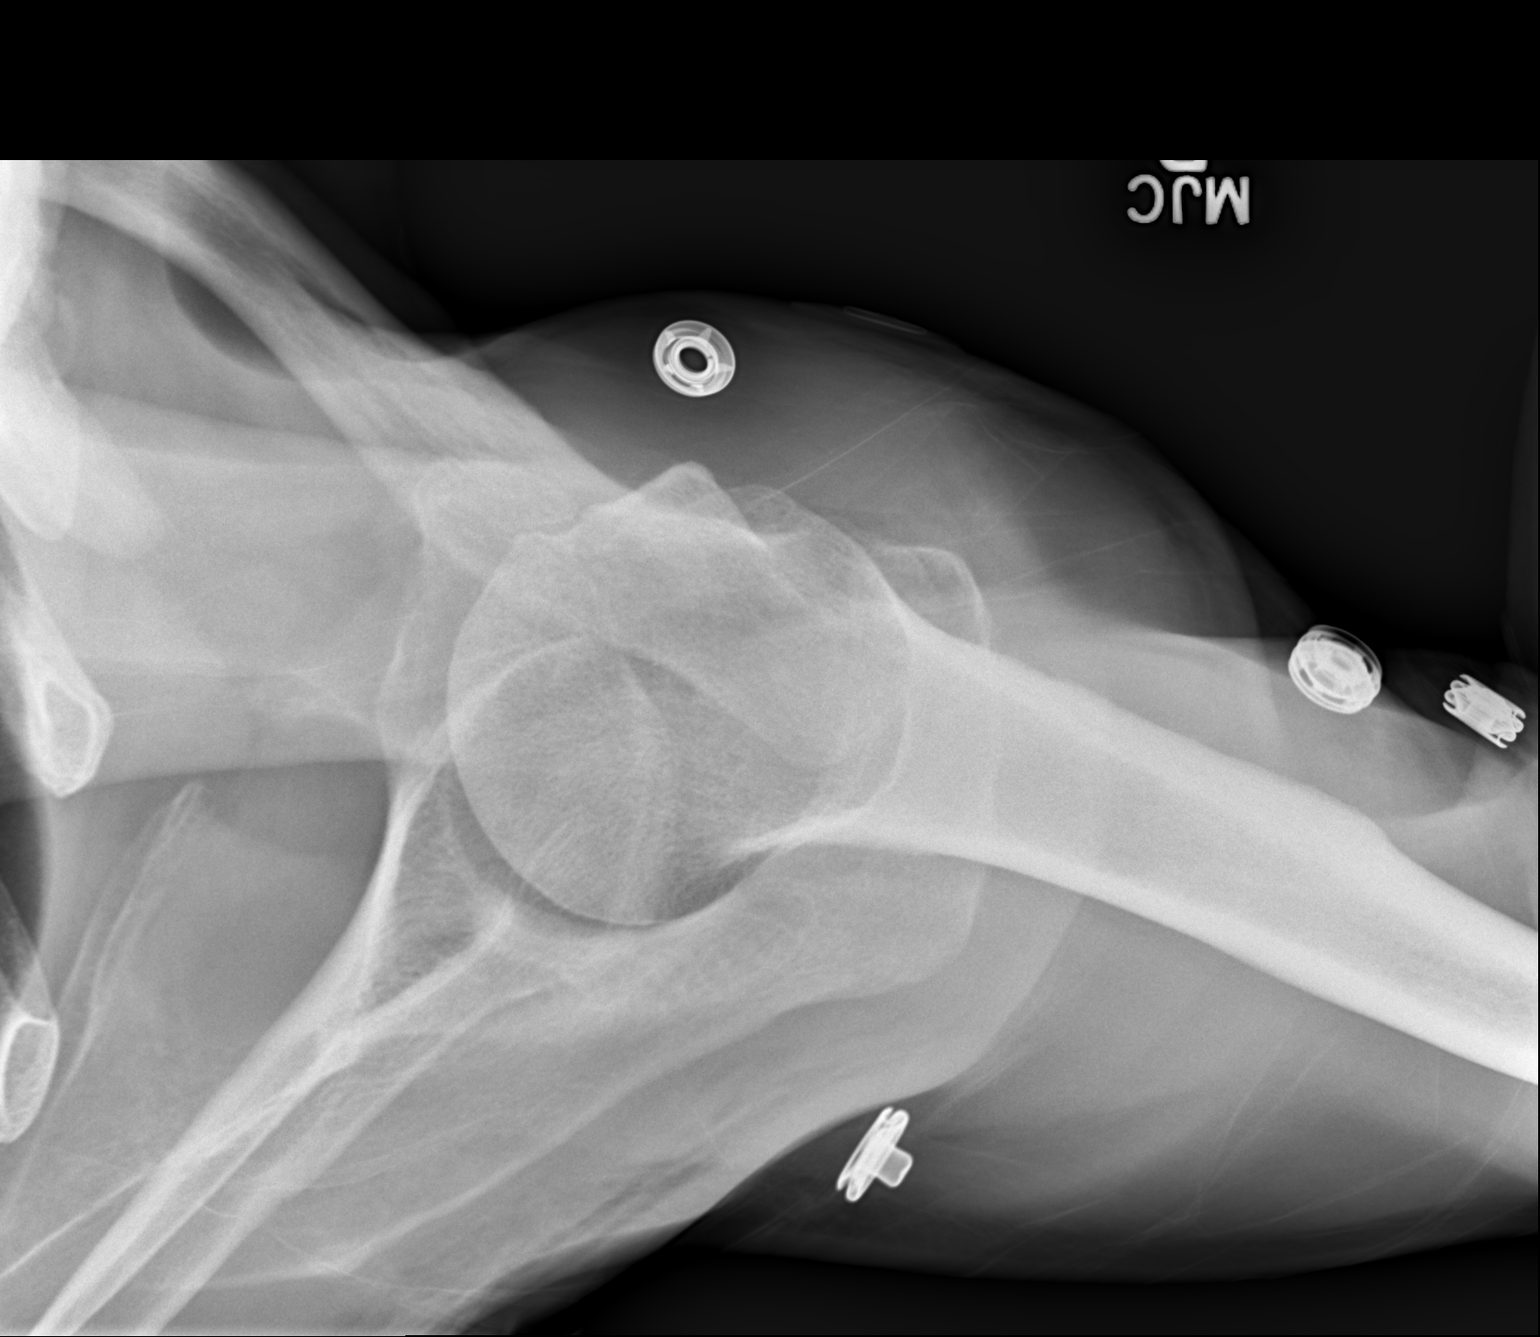

[3 of 3 positions shown; findings below may reference images not displayed]

FINDINGS: There is no evidence of fracture or dislocation. The right humeral
head is seated within the glenoid fossa. The acromioclavicular joint
is unremarkable in appearance. No significant soft tissue
abnormalities are seen. The visualized portions of the right lung
are clear.
IMPRESSION: No evidence of fracture or dislocation.

## 2023-09-30 DEATH — deceased
